# Patient Record
Sex: Female | Born: 1950 | Race: Asian | Hispanic: No | Marital: Single | Smoking: Never smoker
Health system: Southern US, Community
[De-identification: ages and names within clinical notes are randomized; demographics above are authoritative.]

## PROBLEM LIST (undated history)

## (undated) DIAGNOSIS — H409 Unspecified glaucoma: Secondary | ICD-10-CM

## (undated) DIAGNOSIS — I10 Essential (primary) hypertension: Secondary | ICD-10-CM

## (undated) DIAGNOSIS — E785 Hyperlipidemia, unspecified: Secondary | ICD-10-CM

## (undated) DIAGNOSIS — C801 Malignant (primary) neoplasm, unspecified: Secondary | ICD-10-CM

## (undated) HISTORY — DX: Hyperlipidemia, unspecified: E78.5

## (undated) HISTORY — DX: Unspecified glaucoma: H40.9

## (undated) HISTORY — PX: BREAST SURGERY: SHX581

---

## 2016-09-10 ENCOUNTER — Emergency Department (HOSPITAL_COMMUNITY): Payer: Self-pay

## 2016-09-10 ENCOUNTER — Inpatient Hospital Stay (HOSPITAL_COMMUNITY)
Admission: EM | Admit: 2016-09-10 | Discharge: 2016-09-12 | DRG: 287 | Disposition: A | Discharge status: Home / Self Care | Payer: Self-pay | Attending: Internal Medicine | Admitting: Internal Medicine

## 2016-09-10 ENCOUNTER — Encounter (HOSPITAL_COMMUNITY): Payer: Self-pay | Admitting: Vascular Surgery

## 2016-09-10 DIAGNOSIS — E781 Pure hyperglyceridemia: Secondary | ICD-10-CM | POA: Diagnosis present

## 2016-09-10 DIAGNOSIS — I10 Essential (primary) hypertension: Secondary | ICD-10-CM | POA: Diagnosis present

## 2016-09-10 DIAGNOSIS — R001 Bradycardia, unspecified: Secondary | ICD-10-CM | POA: Diagnosis present

## 2016-09-10 DIAGNOSIS — R079 Chest pain, unspecified: Secondary | ICD-10-CM | POA: Diagnosis present

## 2016-09-10 DIAGNOSIS — R0609 Other forms of dyspnea: Secondary | ICD-10-CM | POA: Diagnosis present

## 2016-09-10 DIAGNOSIS — R072 Precordial pain: Principal | ICD-10-CM | POA: Diagnosis present

## 2016-09-10 HISTORY — DX: Essential (primary) hypertension: I10

## 2016-09-10 LAB — BASIC METABOLIC PANEL
ANION GAP: 8 (ref 5–15)
BUN: 9 mg/dL (ref 6–20)
CHLORIDE: 103 mmol/L (ref 101–111)
CO2: 26 mmol/L (ref 22–32)
Calcium: 8.9 mg/dL (ref 8.9–10.3)
Creatinine, Ser: 0.69 mg/dL (ref 0.44–1.00)
Glucose, Bld: 105 mg/dL — ABNORMAL HIGH (ref 65–99)
POTASSIUM: 3.8 mmol/L (ref 3.5–5.1)
SODIUM: 137 mmol/L (ref 135–145)

## 2016-09-10 LAB — CBC
HEMATOCRIT: 37.3 % (ref 36.0–46.0)
HEMOGLOBIN: 12.3 g/dL (ref 12.0–15.0)
MCH: 28.5 pg (ref 26.0–34.0)
MCHC: 33 g/dL (ref 30.0–36.0)
MCV: 86.5 fL (ref 78.0–100.0)
Platelets: 241 10*3/uL (ref 150–400)
RBC: 4.31 MIL/uL (ref 3.87–5.11)
RDW: 12.3 % (ref 11.5–15.5)
WBC: 5.5 10*3/uL (ref 4.0–10.5)

## 2016-09-10 LAB — I-STAT TROPONIN, ED: TROPONIN I, POC: 0 ng/mL (ref 0.00–0.08)

## 2016-09-10 MED ORDER — ASPIRIN 325 MG PO TABS
325.0000 mg | ORAL_TABLET | Freq: Once | ORAL | Status: AC
  Administered 2016-09-10: 325 mg via ORAL
  Filled 2016-09-10: qty 1

## 2016-09-10 NOTE — ED Provider Notes (Signed)
[facility]-EMERGENCY DEPT Provider Note   CSN: [id number] Arrival date & time: 09/10/16  2030  By signing my name below, I, Dana Mora, attest that this documentation has been prepared under the direction and in the presence of Dana Bilis, MD. Electronically Signed: Deland Mora, ED Scribe. 09/11/16. 12:23 AM.   History   Chief Complaint Chief Complaint  Patient presents with  . Chest Pain    The history is provided by the patient. No language interpreter was used.    HPI Comments: Dana Mora is a 66 y.o. female, with a PMHx of HTN per daughter, presents to the Emergency Department complaining of left-sided intermittent CP that began on 09/09/2016, and worsened PTA. The episodes of pain typically last a few hours. Her pain is non-radiating and she describes the pain as throbbing. She reports associated SOB with episodes of CP today. She also notes dizziness. Pain and SOB increases upon exertion (walking up the stairs), and resting relieves the pain. She also notes increased pain with deep breaths. She denies associated leg swelling. Pt also denies PMHx associated with her current symptoms including PE, MI, HLD, DM, and DVT. Pt does not take medication for HTN. She does not smoke. Symptoms are currently relieved.  History reviewed. No pertinent past medical history.  There are no active problems to display for this patient.   History reviewed. No pertinent surgical history.  OB History    No data available       Home Medications   none   Family History No family history on file.  Social History Social History  Substance Use Topics  . Smoking status: Never Smoker  . Smokeless tobacco: Never Used  . Alcohol use No     Allergies   Patient has no allergy information on record.   Review of Systems Review of Systems All systems reviewed and are negative for acute change except as noted in the HPI.    Physical Exam Updated Vital Signs BP (!) 154/95  (BP Location: Left Arm)   Pulse 64   Temp 97.8 F (36.6 C) (Oral)   Resp 16   SpO2 99%   Physical Exam  Constitutional: She is oriented to person, place, and time. She appears well-developed and well-nourished. No distress.  HENT:  Head: Normocephalic and atraumatic.  Eyes: EOM are normal.  Neck: Normal range of motion.  Cardiovascular: Normal rate, regular rhythm and normal heart sounds.   Pulmonary/Chest: Effort normal and breath sounds normal.  Abdominal: Soft. She exhibits no distension. There is no tenderness.  Musculoskeletal: Normal range of motion.  Neurological: She is alert and oriented to person, place, and time.  Skin: Skin is warm and dry.  Psychiatric: She has a normal mood and affect. Judgment normal.  Nursing note and vitals reviewed.    ED Treatments / Results  DIAGNOSTIC STUDIES: Oxygen Saturation is 99% on RA, normal by my interpretation.   COORDINATION OF CARE: 11:22 AM-Discussed next steps with pt and daughter. Pt verbalized understanding and is agreeable with the plan.    Labs (all labs ordered are listed, but only abnormal results are displayed) Labs Reviewed  BASIC METABOLIC PANEL - Abnormal; Notable for the following:       Result Value   Glucose, Bld 105 (*)    All other components within normal limits  CBC  I-STAT TROPOININ, ED    EKG  EKG Interpretation  Date/Time:  Wednesday September 10 2016 20:36:27 EDT Ventricular Rate:  59 PR Interval:  158 QRS  Duration: 80 QT Interval:  418 QTC Calculation: 413 R Axis:   1 Text Interpretation:  Sinus bradycardia Otherwise normal ECG Confirmed by KNOTT MD, DANIEL [phone]) on 09/10/2016 10:15:15 PM       Radiology Dg Chest 2 View  Result Date: 09/10/2016 CLINICAL DATA:  Left-sided chest pain or 2 days EXAM: CHEST  2 VIEW COMPARISON:  None. FINDINGS: Cardiac shadow is mildly enlarged. Aortic calcifications are again seen. The lungs are well aerated bilaterally without focal infiltrate or sizable  effusion. No acute bony abnormality is noted. IMPRESSION: No active cardiopulmonary disease. Electronically Signed   By: Alcide Clever M.D.   On: 09/10/2016 21:45    Procedures Procedures (including critical care time)  Medications Ordered in ED Medications - No data to display   Initial Impression / Assessment and Plan / ED Course  I have reviewed the triage vital signs and the nursing notes.  Pertinent labs & imaging results that were available during my care of the patient were reviewed by me and considered in my medical decision making (see chart for details).    Pt will be admitted for cardiac rule out. D dimer negative. Concerning story and no follow up  Final Clinical Impressions(s) / ED Diagnoses   Final diagnoses:  Precordial pain    New Prescriptions New Prescriptions   No medications on file  I personally performed the services described in this documentation, which was scribed in my presence. The recorded information has been reviewed and is accurate.       Dana Bilis, MD 09/13/16 [phone]

## 2016-09-10 NOTE — ED Notes (Signed)
Pt states she started having chest pain yesterday but had an episode of throbbing left sided chest pain that was worse and came on earlier this afternoon. She states that she is currently not having any chest pain but does feel tired.

## 2016-09-10 NOTE — ED Triage Notes (Signed)
Pt reports to the ED for eval of left sided CP that started yesterday. She describes the pain as a throbbing. She also reports some associated SOB. She denies any personal cardiac hx or any hx of chronic medical issues. Walking up the stairs caused increased pain and SOB. Resting makes the pain better. She has not had any medication for this pain. Denies any recent cough or colds

## 2016-09-11 ENCOUNTER — Encounter (HOSPITAL_COMMUNITY)
Admission: EM | Disposition: A | Discharge status: Home / Self Care | Payer: Self-pay | Source: Home / Self Care | Attending: Internal Medicine

## 2016-09-11 ENCOUNTER — Encounter (HOSPITAL_COMMUNITY): Payer: Self-pay | Admitting: Internal Medicine

## 2016-09-11 ENCOUNTER — Observation Stay (HOSPITAL_BASED_OUTPATIENT_CLINIC_OR_DEPARTMENT_OTHER): Payer: Self-pay

## 2016-09-11 DIAGNOSIS — R0609 Other forms of dyspnea: Secondary | ICD-10-CM | POA: Diagnosis present

## 2016-09-11 DIAGNOSIS — R072 Precordial pain: Principal | ICD-10-CM

## 2016-09-11 DIAGNOSIS — R001 Bradycardia, unspecified: Secondary | ICD-10-CM | POA: Diagnosis present

## 2016-09-11 DIAGNOSIS — R079 Chest pain, unspecified: Secondary | ICD-10-CM

## 2016-09-11 DIAGNOSIS — I10 Essential (primary) hypertension: Secondary | ICD-10-CM | POA: Diagnosis present

## 2016-09-11 HISTORY — PX: LEFT HEART CATH AND CORONARY ANGIOGRAPHY: CATH118249

## 2016-09-11 LAB — BASIC METABOLIC PANEL
Anion gap: 6 (ref 5–15)
BUN: 8 mg/dL (ref 6–20)
CALCIUM: 8.8 mg/dL — AB (ref 8.9–10.3)
CO2: 27 mmol/L (ref 22–32)
CREATININE: 0.7 mg/dL (ref 0.44–1.00)
Chloride: 106 mmol/L (ref 101–111)
GFR calc Af Amer: >60 mL/min (ref >60)
GFR calc non Af Amer: >60 mL/min (ref >60)
GLUCOSE: 109 mg/dL — AB (ref 65–99)
Potassium: 4.1 mmol/L (ref 3.5–5.1)
Sodium: 139 mmol/L (ref 135–145)

## 2016-09-11 LAB — CBC WITH DIFFERENTIAL/PLATELET
BASOS PCT: 0 %
Basophils Absolute: 0 10*3/uL (ref 0.0–0.1)
Eosinophils Absolute: 0.1 10*3/uL (ref 0.0–0.7)
Eosinophils Relative: 3 %
HEMATOCRIT: 37 % (ref 36.0–46.0)
Hemoglobin: 12.3 g/dL (ref 12.0–15.0)
LYMPHS PCT: 53 %
Lymphs Abs: 2.5 10*3/uL (ref 0.7–4.0)
MCH: 28.7 pg (ref 26.0–34.0)
MCHC: 33.2 g/dL (ref 30.0–36.0)
MCV: 86.4 fL (ref 78.0–100.0)
MONO ABS: 0.4 10*3/uL (ref 0.1–1.0)
MONOS PCT: 8 %
NEUTROS ABS: 1.6 10*3/uL — AB (ref 1.7–7.7)
Neutrophils Relative %: 36 %
Platelets: 209 10*3/uL (ref 150–400)
RBC: 4.28 MIL/uL (ref 3.87–5.11)
RDW: 12.6 % (ref 11.5–15.5)
WBC: 4.6 10*3/uL (ref 4.0–10.5)

## 2016-09-11 LAB — LIPID PANEL
Cholesterol: 175 mg/dL (ref 0–200)
HDL: 39 mg/dL — ABNORMAL LOW (ref >40)
LDL Cholesterol: 74 mg/dL (ref 0–99)
Total CHOL/HDL Ratio: 4.5 RATIO
Triglycerides: 310 mg/dL — ABNORMAL HIGH (ref <150)
VLDL: 62 mg/dL — ABNORMAL HIGH (ref 0–40)

## 2016-09-11 LAB — ECHOCARDIOGRAM COMPLETE
Height: 64 in
Weight: 2401.6 oz

## 2016-09-11 LAB — TSH: TSH: 2.975 u[IU]/mL (ref 0.350–4.500)

## 2016-09-11 LAB — I-STAT TROPONIN, ED: Troponin i, poc: 0 ng/mL (ref 0.00–0.08)

## 2016-09-11 LAB — TROPONIN I
Troponin I: <0.03 ng/mL (ref <0.03)
Troponin I: <0.03 ng/mL (ref <0.03)
Troponin I: <0.03 ng/mL (ref <0.03)

## 2016-09-11 LAB — PROTIME-INR
INR: 0.99
PROTHROMBIN TIME: 13.1 s (ref 11.4–15.2)

## 2016-09-11 LAB — HIV ANTIBODY (ROUTINE TESTING W REFLEX): HIV Screen 4th Generation wRfx: NONREACTIVE

## 2016-09-11 LAB — D-DIMER, QUANTITATIVE: D-Dimer, Quant: <0.27 ug/mL-FEU (ref 0.00–0.50)

## 2016-09-11 SURGERY — LEFT HEART CATH AND CORONARY ANGIOGRAPHY
Anesthesia: LOCAL

## 2016-09-11 MED ORDER — HEPARIN (PORCINE) IN NACL 2-0.9 UNIT/ML-% IJ SOLN
INTRAMUSCULAR | Status: DC | PRN
  Administered 2016-09-11: 17:00:00 via INTRA_ARTERIAL

## 2016-09-11 MED ORDER — ATORVASTATIN CALCIUM 80 MG PO TABS: 80.0000 mg | ORAL_TABLET | ORAL | Status: DC

## 2016-09-11 MED ORDER — GI COCKTAIL ~~LOC~~
30.0000 mL | Freq: Four times a day (QID) | ORAL | Status: DC | PRN
  Administered 2016-09-11: 30 mL via ORAL
  Filled 2016-09-11: qty 30

## 2016-09-11 MED ORDER — MIDAZOLAM HCL 2 MG/2ML IJ SOLN
INTRAMUSCULAR | Status: AC
  Filled 2016-09-11: qty 2

## 2016-09-11 MED ORDER — MORPHINE SULFATE (PF) 4 MG/ML IV SOLN
2.0000 mg | INTRAVENOUS | Status: DC | PRN
  Administered 2016-09-11: 2 mg via INTRAVENOUS
  Filled 2016-09-11: qty 1

## 2016-09-11 MED ORDER — ONDANSETRON HCL 4 MG/2ML IJ SOLN: 4.0000 mg | Freq: Four times a day (QID) | INTRAMUSCULAR | Status: DC | PRN

## 2016-09-11 MED ORDER — HEPARIN (PORCINE) IN NACL 2-0.9 UNIT/ML-% IJ SOLN
INTRAMUSCULAR | Status: AC
  Filled 2016-09-11: qty 1000

## 2016-09-11 MED ORDER — ASPIRIN 81 MG PO CHEW
81.0000 mg | CHEWABLE_TABLET | Freq: Every day | ORAL | Status: DC
  Administered 2016-09-11 – 2016-09-12 (×2): 81 mg via ORAL
  Filled 2016-09-11 (×2): qty 1

## 2016-09-11 MED ORDER — VERAPAMIL HCL 2.5 MG/ML IV SOLN
INTRAVENOUS | Status: AC
  Filled 2016-09-11: qty 2

## 2016-09-11 MED ORDER — HEPARIN SODIUM (PORCINE) 1000 UNIT/ML IJ SOLN
INTRAMUSCULAR | Status: AC
  Filled 2016-09-11: qty 1

## 2016-09-11 MED ORDER — LIDOCAINE HCL (PF) 1 % IJ SOLN
INTRAMUSCULAR | Status: AC
  Filled 2016-09-11: qty 30

## 2016-09-11 MED ORDER — IOPAMIDOL (ISOVUE-370) INJECTION 76%
INTRAVENOUS | Status: AC
  Filled 2016-09-11: qty 100

## 2016-09-11 MED ORDER — NITROGLYCERIN 0.4 MG SL SUBL
0.4000 mg | SUBLINGUAL_TABLET | SUBLINGUAL | Status: DC | PRN
  Administered 2016-09-11: 0.4 mg via SUBLINGUAL
  Filled 2016-09-11: qty 1

## 2016-09-11 MED ORDER — ENOXAPARIN SODIUM 40 MG/0.4ML ~~LOC~~ SOLN
40.0000 mg | SUBCUTANEOUS | Status: DC
  Administered 2016-09-11 – 2016-09-12 (×2): 40 mg via SUBCUTANEOUS
  Filled 2016-09-11 (×2): qty 0.4

## 2016-09-11 MED ORDER — SODIUM CHLORIDE 0.9 % IV SOLN: 250.0000 mL | INTRAVENOUS | Status: DC | PRN

## 2016-09-11 MED ORDER — HYDRALAZINE HCL 20 MG/ML IJ SOLN: 10.0000 mg | INTRAMUSCULAR | Status: DC | PRN

## 2016-09-11 MED ORDER — ONDANSETRON HCL 4 MG/2ML IJ SOLN
4.0000 mg | Freq: Four times a day (QID) | INTRAMUSCULAR | Status: DC | PRN
  Administered 2016-09-11: 4 mg via INTRAVENOUS
  Filled 2016-09-11: qty 2

## 2016-09-11 MED ORDER — SODIUM CHLORIDE 0.9% FLUSH
3.0000 mL | Freq: Two times a day (BID) | INTRAVENOUS | Status: DC
  Administered 2016-09-11: 3 mL via INTRAVENOUS

## 2016-09-11 MED ORDER — SODIUM CHLORIDE 0.9% FLUSH: 3.0000 mL | INTRAVENOUS | Status: DC | PRN

## 2016-09-11 MED ORDER — HEPARIN SODIUM (PORCINE) 1000 UNIT/ML IJ SOLN
INTRAMUSCULAR | Status: DC | PRN
  Administered 2016-09-11: 3500 [IU] via INTRAVENOUS

## 2016-09-11 MED ORDER — FENTANYL CITRATE (PF) 100 MCG/2ML IJ SOLN
INTRAMUSCULAR | Status: AC
  Filled 2016-09-11: qty 2

## 2016-09-11 MED ORDER — SODIUM CHLORIDE 0.9 % IV SOLN: INTRAVENOUS | Status: AC

## 2016-09-11 MED ORDER — SODIUM CHLORIDE 0.9 % WEIGHT BASED INFUSION: 1.0000 mL/kg/h | INTRAVENOUS | Status: DC

## 2016-09-11 MED ORDER — ATORVASTATIN CALCIUM 80 MG PO TABS
80.0000 mg | ORAL_TABLET | Freq: Every day | ORAL | Status: DC
Start: 2016-09-12 — End: 2016-09-11

## 2016-09-11 MED ORDER — NITROGLYCERIN 0.4 MG SL SUBL
SUBLINGUAL_TABLET | SUBLINGUAL | Status: AC
  Administered 2016-09-11: 0.4 mg
  Filled 2016-09-11: qty 1

## 2016-09-11 MED ORDER — IOPAMIDOL (ISOVUE-370) INJECTION 76%
INTRAVENOUS | Status: DC | PRN
  Administered 2016-09-11: 55 mL via INTRA_ARTERIAL

## 2016-09-11 MED ORDER — ASPIRIN 81 MG PO CHEW: 81.0000 mg | CHEWABLE_TABLET | ORAL | Status: AC

## 2016-09-11 MED ORDER — ACETAMINOPHEN 325 MG PO TABS: 650.0000 mg | ORAL_TABLET | ORAL | Status: DC | PRN

## 2016-09-11 MED ORDER — ATORVASTATIN CALCIUM 20 MG PO TABS: 20.0000 mg | ORAL_TABLET | ORAL | Status: DC

## 2016-09-11 MED ORDER — ATORVASTATIN CALCIUM 20 MG PO TABS: 20.0000 mg | ORAL_TABLET | Freq: Every day | ORAL | Status: DC

## 2016-09-11 MED ORDER — HEPARIN (PORCINE) IN NACL 2-0.9 UNIT/ML-% IJ SOLN
INTRAMUSCULAR | Status: DC | PRN
  Administered 2016-09-11: 1000 mL

## 2016-09-11 MED ORDER — MIDAZOLAM HCL 2 MG/2ML IJ SOLN
INTRAMUSCULAR | Status: DC | PRN
  Administered 2016-09-11: 2 mg via INTRAVENOUS

## 2016-09-11 MED ORDER — SODIUM CHLORIDE 0.9 % WEIGHT BASED INFUSION
3.0000 mL/kg/h | INTRAVENOUS | Status: DC
  Administered 2016-09-11: 3 mL/kg/h via INTRAVENOUS

## 2016-09-11 MED ORDER — FENTANYL CITRATE (PF) 100 MCG/2ML IJ SOLN
INTRAMUSCULAR | Status: DC | PRN
  Administered 2016-09-11: 25 ug via INTRAVENOUS

## 2016-09-11 MED ORDER — ATORVASTATIN CALCIUM 20 MG PO TABS
20.0000 mg | ORAL_TABLET | ORAL | Status: DC
  Administered 2016-09-11: 20 mg via ORAL
  Filled 2016-09-11: qty 1

## 2016-09-11 MED ORDER — LIDOCAINE HCL (PF) 1 % IJ SOLN
INTRAMUSCULAR | Status: DC | PRN
  Administered 2016-09-11: 2 mL

## 2016-09-11 SURGICAL SUPPLY — 11 items
CATH 5FR JL3.5 JR4 ANG PIG MP (CATHETERS) ×2 IMPLANT
DEVICE RAD COMP TR BAND LRG (VASCULAR PRODUCTS) ×2 IMPLANT
GLIDESHEATH SLEND SS 6F .021 (SHEATH) ×2 IMPLANT
GUIDEWIRE INQWIRE 1.5J.035X260 (WIRE) ×1 IMPLANT
INQWIRE 1.5J .035X260CM (WIRE) ×2
KIT HEART LEFT (KITS) ×2 IMPLANT
PACK CARDIAC CATHETERIZATION (CUSTOM PROCEDURE TRAY) ×2 IMPLANT
SYR MEDRAD MARK V 150ML (SYRINGE) ×2 IMPLANT
TRANSDUCER W/STOPCOCK (MISCELLANEOUS) ×2 IMPLANT
TUBING CIL FLEX 10 FLL-RA (TUBING) ×2 IMPLANT
WIRE HI TORQ VERSACORE-J 145CM (WIRE) ×2 IMPLANT

## 2016-09-11 NOTE — Consult Note (Signed)
Cardiology Consult    Patient ID: Dana Mora MRN: [id number], DOB/AGE: 09-26-1950   Admit date: 09/10/2016 Date of Consult: 09/11/2016  Primary Physician: No PCP Per Patient Primary Cardiologist: new - Dr. Antoine Poche Requesting Provider: Dr. Susie Cassette  Reason for Consult: chest pain  Patient Profile    Dana Mora is a 66 yo female with a PMH significant for HTN who presented to [facility] with 2-day history of left-sided chest pain. She is visiting from [place], records unavailable.   Dana Mora is a 66 y.o. female who is being seen today for the evaluation of chest pain at the request of Dr. Susie Cassette.   Past Medical History   Past Medical History:  Diagnosis Date  . HTN (hypertension)     History reviewed. No pertinent surgical history.   Allergies  No Known Allergies  History of Present Illness    Dana Mora is visiting from [place] and presented to [facility] with 2-day history of left-sided chest pain without radiation. Her daughter is present at bedside and helps translate. History collected from daughter and patient.   Pt states 2 days ago, she was climbing a flight of stairs when she had sudden onset of substernal-left-sided chest pain. The pain did not radiate anywhere, was rated as a 6/10, and was relieved with rest. She felt very weak after. She states she had shortness of breath and pre-syncope with the chest pain. She also reports feelings of palpitations. She did not tell her family at the time of this first episode of chest pain. Yesterday, she again climbed stairs and had a recurrence of the substernal chest pain, shortness of breath, and pre-syncope. She did not have a LOC. She called her daughter who brought her to [facility]. The second episode of chest pain lasted approximately 2 hrs. She is unsure what relieved the pain. The pain was worse with inspiration, but was not positional. The pain is worse with exertion. She denies diaphoresis, vision changes,  N/V, and lower extremity swelling. The family states she does not have significant health problems, other than hypertension, and never complains of pain.   She states her only medical problem is HTN, but she does not take medication at home other than a multi-vitamin. She has never seen a cardiologist.  Shortly after leaving patient room, the patient c/o of a third episode of chest pain. EKG stable.     Inpatient Medications    . aspirin  81 mg Oral Daily  . atorvastatin  20 mg Oral q1800  . enoxaparin (LOVENOX) injection  40 mg Subcutaneous Q24H     Outpatient Medications    Prior to Admission medications   Medication Sig Start Date End Date Taking? Authorizing Provider  Multiple Vitamin (MULTIVITAMIN WITH MINERALS) TABS tablet Take 1 tablet by mouth daily.   Yes Historical Provider, MD     Family History     Family History  Problem Relation Age of Onset  . Hypertension Father     Social History    Social History   Social History  . Marital status: Widowed    Spouse name: N/A  . Number of children: N/A  . Years of education: N/A   Occupational History  . Not on file.   Social History Main Topics  . Smoking status: Never Smoker  . Smokeless tobacco: Never Used  . Alcohol use No  . Drug use: No  . Sexual activity: Not Currently   Other Topics Concern  . Not on file   Social  History Narrative   Visiting from [place], living with daughter at the moment.     Review of Systems    General:  No chills, fever, night sweats or weight changes.  Cardiovascular:  + chest pain, No dyspnea on exertion, edema, orthopnea, + palpitations, no paroxysmal nocturnal dyspnea. Dermatological: No rash, lesions/masses Respiratory: No cough, + dyspnea Urologic: No hematuria, dysuria Abdominal:   No nausea, vomiting, diarrhea, bright red blood per rectum, melena, or hematemesis Neurologic:  No visual changes, changes in mental status.  All other systems reviewed and are  otherwise negative except as noted above.  Physical Exam    Blood pressure 134/73, pulse (!) 57, temperature 97.6 F (36.4 C), temperature source Oral, resp. rate 18, height  (1.626 m), weight 150 lb 1.6 oz (68.1 kg), SpO2 100 %.  General: Pleasant, NAD Psych: Normal affect. Neuro: Alert and oriented X 3. Moves all extremities spontaneously. HEENT: Normal  Neck: Supple without bruits or JVD. Lungs:  Resp regular and unlabored, CTA. Heart: RRR no s3, s4, or murmurs. Abdomen: Soft, non-tender, non-distended, BS + x 4.  Extremities: No clubbing, cyanosis or edema. DP/PT/Radials 2+ and equal bilaterally.  Labs    Troponin [facility] Test)  Recent Labs  09/10/16 2353  TROPIPOC 0.00    Recent Labs  09/11/16 0243 09/11/16 0433 09/11/16 0740  TROPONINI <0.03 <0.03 <0.03   Lab Results  Component Value Date   WBC 4.6 09/11/2016   HGB 12.3 09/11/2016   HCT 37.0 09/11/2016   MCV 86.4 09/11/2016   PLT 209 09/11/2016     Recent Labs Lab 09/11/16 0433  NA 139  K 4.1  CL 106  CO2 27  BUN 8  CREATININE 0.70  CALCIUM 8.8*  GLUCOSE 109*   Lab Results  Component Value Date   CHOL 175 09/11/2016   HDL 39 (L) 09/11/2016   LDLCALC 74 09/11/2016   TRIG 310 (H) 09/11/2016   Lab Results  Component Value Date   DDIMER <0.27 09/10/2016     Radiology Studies    Dg Chest 2 View  Result Date: 09/10/2016 CLINICAL DATA:  Left-sided chest pain or 2 days EXAM: CHEST  2 VIEW COMPARISON:  None. FINDINGS: Cardiac shadow is mildly enlarged. Aortic calcifications are again seen. The lungs are well aerated bilaterally without focal infiltrate or sizable effusion. No acute bony abnormality is noted. IMPRESSION: No active cardiopulmonary disease. Electronically Signed   By: Alcide Clever M.D.   On: 09/10/2016 21:45    ECG & Cardiac Imaging    EKG 09/10/16: NSR   Echocardiogram 09/11/16: pending   Assessment & Plan    1. Chest pain - troponin x 3 negative - EKG NSR,  without signs of ischemia - D dimer was negative (< 0.27) - CXR without acute findings The pt describes chest pain with both typical and atypical features. Her troponins have been negative and her EKG is without ischemic changes. Echocardiogram is pending. Labs are WNL with the exception of elevated triglycerides. HTN and obesity are her only risk factors. She does not have a family history of heart disease. Will discuss with attending further ischemic workup, cardiac CT vs heart catheterization.    2. HTN - pt states she does not take medications at home for blood pressure - sBP is running in the 140-160s - will suggest an antihypertensive after echo read    3. High triglycerides - triglycerides 310    Signed, Marcelino Duster, [place] 09/11/2016, 12:36 PM [phone]  History and all data above reviewed.  Patient examined.  I agree with the findings as above.  The patient presents for evaluation of chest pain.  She has no past cardiac history.  She developed chest pain after walking up stairs yesterday.  This was a left upper severe throbbing.  She took Aleve and had no significant improvement.  She came to the ED and her pain had improved.  She has had no acute EKG changes and no enzyme elevation. However, she had recurrent pain that went away with NTG.  She is currently pain free.  She has typically been active without complaints.  She does not see an MD routinely and does not think that she has DM, hyperlipidemia or HTN but this is not clear.  She has been visiting her family in the [place] for 4 months but lives in [place].   The patient exam reveals COR:RRR  ,  Lungs: Clear  ,  Abd: Positive bowel sounds, no rebound no guarding, Ext No edema  .  All available labs, radiology testing, previous records reviewed. Agree with documented assessment and plan. Chest pain.  Consistent with resting unstable angina.  Cardiac cath is indicated.  The patient understands that risks included but are not  limited to stroke (1 in 1000), death (1 in 1000), kidney failure [usually temporary] (1 in 500), bleeding (1 in 200), allergic reaction [possibly serious] (1 in 200).  The patient understands and agrees to proceed.     Fayrene Fearing Kalven Ganim  2:42 PM  09/11/2016

## 2016-09-11 NOTE — Interval H&P Note (Signed)
Cath Lab Visit (complete for each Cath Lab visit)  Clinical Evaluation Leading to the Procedure:   ACS: Yes.    Non-ACS:    Anginal Classification: CCS IV  Anti-ischemic medical therapy: Minimal Therapy (1 class of medications)  Non-Invasive Test Results: No non-invasive testing performed  Prior CABG: No previous CABG  New onset chest pain    History and Physical Interval Note:  09/11/2016 4:27 PM  Dana Mora  has presented today for surgery, with the diagnosis of cp  The various methods of treatment have been discussed with the patient and family. After consideration of risks, benefits and other options for treatment, the patient has consented to  Procedure(s): Left Heart Cath and Coronary Angiography (N/A) as a surgical intervention .  The patient's history has been reviewed, patient examined, no change in status, stable for surgery.  I have reviewed the patient's chart and labs.  Questions were answered to the patient's satisfaction.     Lance Muss

## 2016-09-11 NOTE — H&P (View-Only) (Signed)
 Cardiology Consult    Patient ID: Dana Mora MRN: [id number], DOB/AGE: 64/20/1952   Admit date: 09/10/2016 Date of Consult: 09/11/2016  Primary Physician: No PCP Per Patient Primary Cardiologist: new - Dr. Zakariye Nee Requesting Provider: Dr. Abrol  Reason for Consult: chest pain  Patient Profile    Dana Mora is a 66 yo female with a PMH significant for HTN who presented to [facility] with 2-day history of left-sided chest pain. She is visiting from [place], records unavailable.   Dana Mora is a 66 y.o. female who is being seen today for the evaluation of chest pain at the request of Dr. Abrol.   Past Medical History   Past Medical History:  Diagnosis Date  . HTN (hypertension)     History reviewed. No pertinent surgical history.   Allergies  No Known Allergies  History of Present Illness    Dana Mora is visiting from [place] and presented to [facility] with 2-day history of left-sided chest pain without radiation. Her daughter is present at bedside and helps translate. History collected from daughter and patient.   Pt states 2 days ago, she was climbing a flight of stairs when she had sudden onset of substernal-left-sided chest pain. The pain did not radiate anywhere, was rated as a 6/10, and was relieved with rest. She felt very weak after. She states she had shortness of breath and pre-syncope with the chest pain. She also reports feelings of palpitations. She did not tell her family at the time of this first episode of chest pain. Yesterday, she again climbed stairs and had a recurrence of the substernal chest pain, shortness of breath, and pre-syncope. She did not have a LOC. She called her daughter who brought her to [facility]. The second episode of chest pain lasted approximately 2 hrs. She is unsure what relieved the pain. The pain was worse with inspiration, but was not positional. The pain is worse with exertion. She denies diaphoresis, vision changes,  N/V, and lower extremity swelling. The family states she does not have significant health problems, other than hypertension, and never complains of pain.   She states her only medical problem is HTN, but she does not take medication at home other than a multi-vitamin. She has never seen a cardiologist.  Shortly after leaving patient room, the patient c/o of a third episode of chest pain. EKG stable.     Inpatient Medications    . aspirin  81 mg Oral Daily  . atorvastatin  20 mg Oral q1800  . enoxaparin (LOVENOX) injection  40 mg Subcutaneous Q24H     Outpatient Medications    Prior to Admission medications   Medication Sig Start Date End Date Taking? Authorizing Provider  Multiple Vitamin (MULTIVITAMIN WITH MINERALS) TABS tablet Take 1 tablet by mouth daily.   Yes Historical Provider, MD     Family History     Family History  Problem Relation Age of Onset  . Hypertension Father     Social History    Social History   Social History  . Marital status: Widowed    Spouse name: N/A  . Number of children: N/A  . Years of education: N/A   Occupational History  . Not on file.   Social History Main Topics  . Smoking status: Never Smoker  . Smokeless tobacco: Never Used  . Alcohol use No  . Drug use: No  . Sexual activity: Not Currently   Other Topics Concern  . Not on file   Social   History Narrative   Visiting from [place], living with daughter at the moment.     Review of Systems    General:  No chills, fever, night sweats or weight changes.  Cardiovascular:  + chest pain, No dyspnea on exertion, edema, orthopnea, + palpitations, no paroxysmal nocturnal dyspnea. Dermatological: No rash, lesions/masses Respiratory: No cough, + dyspnea Urologic: No hematuria, dysuria Abdominal:   No nausea, vomiting, diarrhea, bright red blood per rectum, melena, or hematemesis Neurologic:  No visual changes, changes in mental status.  All other systems reviewed and are  otherwise negative except as noted above.  Physical Exam    Blood pressure 134/73, pulse (!) 57, temperature 97.6 F (36.4 C), temperature source Oral, resp. rate 18, height 5' 4" (1.626 m), weight 150 lb 1.6 oz (68.1 kg), SpO2 100 %.  General: Pleasant, NAD Psych: Normal affect. Neuro: Alert and oriented X 3. Moves all extremities spontaneously. HEENT: Normal  Neck: Supple without bruits or JVD. Lungs:  Resp regular and unlabored, CTA. Heart: RRR no s3, s4, or murmurs. Abdomen: Soft, non-tender, non-distended, BS + x 4.  Extremities: No clubbing, cyanosis or edema. DP/PT/Radials 2+ and equal bilaterally.  Labs    Troponin (Point of Care Test)  Recent Labs  09/10/16 2353  TROPIPOC 0.00    Recent Labs  09/11/16 0243 09/11/16 0433 09/11/16 0740  TROPONINI <0.03 <0.03 <0.03   Lab Results  Component Value Date   WBC 4.6 09/11/2016   HGB 12.3 09/11/2016   HCT 37.0 09/11/2016   MCV 86.4 09/11/2016   PLT 209 09/11/2016     Recent Labs Lab 09/11/16 0433  NA 139  K 4.1  CL 106  CO2 27  BUN 8  CREATININE 0.70  CALCIUM 8.8*  GLUCOSE 109*   Lab Results  Component Value Date   CHOL 175 09/11/2016   HDL 39 (L) 09/11/2016   LDLCALC 74 09/11/2016   TRIG 310 (H) 09/11/2016   Lab Results  Component Value Date   DDIMER <0.27 09/10/2016     Radiology Studies    Dg Chest 2 View  Result Date: 09/10/2016 CLINICAL DATA:  Left-sided chest pain or 2 days EXAM: CHEST  2 VIEW COMPARISON:  None. FINDINGS: Cardiac shadow is mildly enlarged. Aortic calcifications are again seen. The lungs are well aerated bilaterally without focal infiltrate or sizable effusion. No acute bony abnormality is noted. IMPRESSION: No active cardiopulmonary disease. Electronically Signed   By: Mark  Lukens M.D.   On: 09/10/2016 21:45    ECG & Cardiac Imaging    EKG 09/10/16: NSR   Echocardiogram 09/11/16: pending   Assessment & Plan    1. Chest pain - troponin x 3 negative - EKG NSR,  without signs of ischemia - D dimer was negative (< 0.27) - CXR without acute findings The pt describes chest pain with both typical and atypical features. Her troponins have been negative and her EKG is without ischemic changes. Echocardiogram is pending. Labs are WNL with the exception of elevated triglycerides. HTN and obesity are her only risk factors. She does not have a family history of heart disease. Will discuss with attending further ischemic workup, cardiac CT vs heart catheterization.    2. HTN - pt states she does not take medications at home for blood pressure - sBP is running in the 140-160s - will suggest an antihypertensive after echo read    3. High triglycerides - triglycerides 310    Signed, Angela Nicole Duke, PA-C 09/11/2016, 12:36 PM [phone]    History and all data above reviewed.  Patient examined.  I agree with the findings as above.  The patient presents for evaluation of chest pain.  She has no past cardiac history.  She developed chest pain after walking up stairs yesterday.  This was a left upper severe throbbing.  She took Aleve and had no significant improvement.  She came to the ED and her pain had improved.  She has had no acute EKG changes and no enzyme elevation. However, she had recurrent pain that went away with NTG.  She is currently pain free.  She has typically been active without complaints.  She does not see an MD routinely and does not think that she has DM, hyperlipidemia or HTN but this is not clear.  She has been visiting her family in the [place] for 4 months but lives in [place].   The patient exam reveals COR:RRR  ,  Lungs: Clear  ,  Abd: Positive bowel sounds, no rebound no guarding, Ext No edema  .  All available labs, radiology testing, previous records reviewed. Agree with documented assessment and plan. Chest pain.  Consistent with resting unstable angina.  Cardiac cath is indicated.  The patient understands that risks included but are not  limited to stroke (1 in 1000), death (1 in 1000), kidney failure [usually temporary] (1 in 500), bleeding (1 in 200), allergic reaction [possibly serious] (1 in 200).  The patient understands and agrees to proceed.     Riddhi Grether  2:42 PM  09/11/2016   

## 2016-09-11 NOTE — Progress Notes (Signed)
  Echocardiogram 2D Echocardiogram has been performed.  Raden Byington T Bianna Haran 09/11/2016, 12:28 PM

## 2016-09-11 NOTE — H&P (Addendum)
History and Physical    Dana Mora JYN:[id number] DOB: 1950-06-09 DOA: 09/10/2016  Referring MD/NP/PA: Dr. Patria Mane PCP: No primary care provider on file.  Patient coming from: Home  Chief Complaint: Chest pain  HPI: Dana Mora is a 66 y.o. female with medical history significant of HTN (not on any medications); who presents with complaints of 2 days of left-sided chest pain. Patient has been visiting for the last 4 months from [place]. Pain is described as constant, throbbing, and does not radiate anywhere. Patient notes that taking a deep inspiratory breath or any exertion such as walking up steps significantly worsens pain. Associated symptoms include shortness of breath, fatigue, and lightheadedness. Denies any palpitations, diaphoresis, cough, nausea, weight gain, leg swelling, focal weakness, headache, or changes in vision. Patient denies any known family history of any heart disease. Patient reports never having previous symptoms like this in the past.  ED Course: Upon admission into the emergency department patient was seen have blheart rates 47-65, blood pressure as high as 158/98, and all other vitals within normal limits. Labs were noted to be unremarkable including negative d-dimer and 2 sets of troponins. EKG showed sinus bradycardia. Chest x-ray showed no acute abnormalities. TRH called to admit for chest pain rule out. Pain symptoms resolved after being given 325 mg of aspirin.  Review of Systems: As per HPI otherwise 10 point review of systems negative.   Past Medical History:  Diagnosis Date  . HTN (hypertension)     History reviewed. No pertinent surgical history.   reports that she has never smoked. She has never used smokeless tobacco. She reports that she does not drink alcohol or use drugs.  No Known Allergies  History reviewed. No pertinent family history.  Prior to Admission medications   Medication Sig Start Date End Date Taking? Authorizing  Provider  Multiple Vitamin (MULTIVITAMIN WITH MINERALS) TABS tablet Take 1 tablet by mouth daily.   Yes Historical Provider, MD    Physical Exam: Constitutional: Older female in NAD, calm, comfortable Vitals:   09/10/16 2315 09/10/16 2330 09/11/16 0000 09/11/16 0015  BP: (!) 158/98 (!) 148/87 (!) 149/94 (!) 158/91  Pulse: 65 (!) 56 (!) 54 (!) 49  Resp: (!) Temp:      TempSrc:      SpO2: 100% 100% 99% 100%   Eyes: PERRL, lids and conjunctivae normal ENMT: Mucous membranes are moist. Posterior pharynx clear of any exudate or lesions.Normal dentition.  Neck: normal, supple, no masses, no thyromegaly Respiratory: clear to auscultation bilaterally, no wheezing, no crackles. Normal respiratory effort. No accessory muscle use.  Cardiovascular: Regular rate and rhythm, no murmurs / rubs / gallops. No extremity edema. 2+ pedal pulses. No carotid bruits. Pain not reproducible with palpation. Abdomen: no tenderness, no masses palpated. No hepatosplenomegaly. Bowel sounds positive.  Musculoskeletal: no clubbing / cyanosis. No joint deformity upper and lower extremities. Good ROM, no contractures. Normal muscle tone.  Skin: no rashes, lesions, ulcers. No induration Neurologic: CN 2-12 grossly intact. Sensation intact, DTR normal. Strength 5/5 in all 4.  Psychiatric: Normal judgment and insight. Alert and oriented x 3. Normal mood.     Labs on Admission: I have personally reviewed following labs and imaging studies  CBC:  Recent Labs Lab 09/10/16 2054  WBC 5.5  HGB 12.3  HCT 37.3  MCV 86.5  PLT 241   Basic Metabolic Panel:  Recent Labs Lab 09/10/16 2054  NA 137  K 3.8  CL 103  CO2  26  GLUCOSE 105*  BUN 9  CREATININE 0.69  CALCIUM 8.9   GFR: CrCl cannot be calculated (Unknown ideal weight.). Liver Function Tests: No results for input(s): AST, ALT, ALKPHOS, BILITOT, PROT, ALBUMIN in the last 168 hours. No results for input(s): LIPASE, AMYLASE in the last 168  hours. No results for input(s): AMMONIA in the last 168 hours. Coagulation Profile: No results for input(s): INR, PROTIME in the last 168 hours. Cardiac Enzymes: No results for input(s): CKTOTAL, CKMB, CKMBINDEX, TROPONINI in the last 168 hours. BNP (last 3 results) No results for input(s): PROBNP in the last 8760 hours. HbA1C: No results for input(s): HGBA1C in the last 72 hours. CBG: No results for input(s): GLUCAP in the last 168 hours. Lipid Profile: No results for input(s): CHOL, HDL, LDLCALC, TRIG, CHOLHDL, LDLDIRECT in the last 72 hours. Thyroid Function Tests: No results for input(s): TSH, T4TOTAL, FREET4, T3FREE, THYROIDAB in the last 72 hours. Anemia Panel: No results for input(s): VITAMINB12, FOLATE, FERRITIN, TIBC, IRON, RETICCTPCT in the last 72 hours. Urine analysis: No results found for: COLORURINE, APPEARANCEUR, LABSPEC, PHURINE, GLUCOSEU, HGBUR, BILIRUBINUR, KETONESUR, PROTEINUR, UROBILINOGEN, NITRITE, LEUKOCYTESUR Sepsis Labs: No results found for this or any previous visit (from the past 240 hour(s)).   Radiological Exams on Admission: Dg Chest 2 View  Result Date: 09/10/2016 CLINICAL DATA:  Left-sided chest pain or 2 days EXAM: CHEST  2 VIEW COMPARISON:  None. FINDINGS: Cardiac shadow is mildly enlarged. Aortic calcifications are again seen. The lungs are well aerated bilaterally without focal infiltrate or sizable effusion. No acute bony abnormality is noted. IMPRESSION: No active cardiopulmonary disease. Electronically Signed   By: Alcide Clever M.D.   On: 09/10/2016 21:45    EKG: Independently reviewed. Sinus Bradycardia  Assessment/Plan Chest pain: Acute. 66 year old female with reports of left-sided chest pain worsened with exertion. Initial workup shows troponins negative 2 and sinus bradycardia on EKG, but no significant ischemic changes. Heart score = 3. Low suspicion for PE as patient has had no recent travel, reports being active, and had negative  d-dimer. - Admit to telemetry bed - Chest pain order set initiated - Trend cardiac enzymes 3 - Check lipid panel in a.m.  - Continue aspirin - Check echocardiogram in a.m. - Consult cardiology in a.m. for further need of workup  Dyspnea on exertion: Chest x-ray was otherwise clear and patient maintaining O2 saturations on room air. Patient has no significant history of tobacco use. - May warrant 6 minute walk test - Check TSH  Bradycardia: Heart rates documented as low as 47 all in the emergency department. - Follow-up telemetry overnight  Essential hypertension: Patient reports previous history of hypertension but not currently taking any medications. Blood pressures noted to be elevated up to 158/98 on admission. - Hydralazine IV when necessary systolic blood pressures >11 - Continue to monitor  DVT prophylaxis: lovenox   Code Status: Full  Family Communication: Discussed plan of care with the patient family present at bedside Disposition Plan: Likely discharge home once medically cleared Consults called: none Admission status:Observation  Clydie Braun MD [facility] Pager [phone]  If 7PM-7AM, please contact night-coverage [email or web address] Password [facility]  09/11/2016, 12:46 AM

## 2016-09-11 NOTE — Progress Notes (Signed)
Patient seen and examined  66 y.o. female with medical history significant of HTN (not on any medications); who presents with complaints of 2 days of left-sided chest pain.Patient notes that taking a deep inspiratory breath or any exertion such as walking up steps significantly worsens pain. EKG showed sinus bradycardia. Chest x-ray showed no acute abnormalities. TRH called to admit for chest pain rule out. D-dimer, serial cardiac enzymes negative Triglycerides 310-initiated statin Cardiology consult for risk stratification,requested

## 2016-09-12 ENCOUNTER — Encounter (HOSPITAL_COMMUNITY): Payer: Self-pay | Admitting: Interventional Cardiology

## 2016-09-12 DIAGNOSIS — I10 Essential (primary) hypertension: Secondary | ICD-10-CM

## 2016-09-12 MED ORDER — ATORVASTATIN CALCIUM 20 MG PO TABS: 20.0000 mg | ORAL_TABLET | ORAL | 2 refills | Status: DC

## 2016-09-12 MED ORDER — ASPIRIN 81 MG PO CHEW: 81.0000 mg | CHEWABLE_TABLET | Freq: Every day | ORAL | 1 refills | Status: DC

## 2016-09-12 MED ORDER — NITROGLYCERIN 0.4 MG SL SUBL: 0.4000 mg | SUBLINGUAL_TABLET | SUBLINGUAL | 12 refills | Status: DC | PRN

## 2016-09-12 MED ORDER — OMEPRAZOLE 20 MG PO CPDR: 20.0000 mg | DELAYED_RELEASE_CAPSULE | Freq: Every day | ORAL | 1 refills | Status: DC

## 2016-09-12 MED ORDER — KETOROLAC TROMETHAMINE 60 MG/2ML IM SOLN: 60.0000 mg | Freq: Once | INTRAMUSCULAR | Status: DC

## 2016-09-12 NOTE — Progress Notes (Signed)
Progress Note  Patient Name: Dana Mora Date of Encounter: 09/12/2016  Primary Cardiologist: New (Hildagard Sobecki)  Subjective   No chest pain.  No SOB  Inpatient Medications    Scheduled Meds: . aspirin  81 mg Oral Daily  . atorvastatin  20 mg Oral Q M,W,F-2000  . enoxaparin (LOVENOX) injection  40 mg Subcutaneous Q24H  . sodium chloride flush  3 mL Intravenous Q12H   Continuous Infusions: . sodium chloride     PRN Meds: sodium chloride, acetaminophen, gi cocktail, hydrALAZINE, morphine injection, nitroGLYCERIN, ondansetron (ZOFRAN) IV, sodium chloride flush   Vital Signs    Vitals:   09/11/16 2100 09/11/16 2201 09/11/16 2300 09/12/16 0524  BP: 133/74 126/81 126/75 123/80  Pulse: 63  63 63  Resp:   18 18  Temp:   98.4 F (36.9 C) 98.1 F (36.7 C)  TempSrc:   Oral Oral  SpO2: 100% 100% 99% 100%  Weight:      Height:        Intake/Output Summary (Last 24 hours) at 09/12/16 0746 Last data filed at 09/11/16 2100  Gross per 24 hour  Intake              590 ml  Output             1500 ml  Net             -910 ml   Filed Weights   09/11/16 0237  Weight: 150 lb 1.6 oz (68.1 kg)    Telemetry    NSR - Personally Reviewed  ECG    NA - Personally Reviewed  Physical Exam   GEN: No acute distress.   Neck: No  JVD Cardiac: RR,  murmurs, rubs, or gallops.  Respiratory: Clear  to auscultation bilaterally. GI: Soft, nontender, non-distended  MS: No  edema; No deformity.  Right wrist without bleeding in bruising  Labs    Chemistry Recent Labs Lab 09/10/16 2054 09/11/16 0433  NA 137 139  K 3.8 4.1  CL 103 106  CO2 26 27  GLUCOSE 105* 109*  BUN 9 8  CREATININE 0.69 0.70  CALCIUM 8.9 8.8*  GFRNONAA >60 >60  GFRAA >60 >60  ANIONGAP 8 6     Hematology Recent Labs Lab 09/10/16 2054 09/11/16 0433  WBC 5.5 4.6  RBC 4.31 4.28  HGB 12.3 12.3  HCT 37.3 37.0  MCV 86.5 86.4  MCH 28.5 28.7  MCHC 33.0 33.2  RDW 12.3 12.6  PLT 241 209     Cardiac Enzymes Recent Labs Lab 09/11/16 0243 09/11/16 0433 09/11/16 0740  TROPONINI <0.03 <0.03 <0.03    Recent Labs Lab 09/10/16 2113 09/10/16 2353  TROPIPOC 0.00 0.00     BNPNo results for input(s): BNP, PROBNP in the last 168 hours.   DDimer  Recent Labs Lab 09/10/16 2054  DDIMER <0.27     Radiology    Dg Chest 2 View  Result Date: 09/10/2016 CLINICAL DATA:  Left-sided chest pain or 2 days EXAM: CHEST  2 VIEW COMPARISON:  None. FINDINGS: Cardiac shadow is mildly enlarged. Aortic calcifications are again seen. The lungs are well aerated bilaterally without focal infiltrate or sizable effusion. No acute bony abnormality is noted. IMPRESSION: No active cardiopulmonary disease. Electronically Signed   By: Alcide Clever M.D.   On: 09/10/2016 21:45    Cardiac Studies   ECHO  - Left ventricle: The cavity size was normal. Systolic function was   normal. The estimated ejection fraction was  in the range of 60%   to 65%. Wall motion was normal; there were no regional wall   motion abnormalities. Features are consistent with a pseudonormal   left ventricular filling pattern, with concomitant abnormal   relaxation and increased filling pressure (grade 2 diastolic   dysfunction). - Aortic valve: Moderately calcified annulus. Trileaflet; normal   thickness leaflets. - Mitral valve: Calcified annulus. - Atrial septum: There was increased thickness of the septum,   consistent with lipomatous hypertrophy. - Tricuspid valve: There was trivial regurgitation. - Pulmonary arteries: Systolic pressure could not be accurately   estimated.  CATH  The left ventricular systolic function is normal.  LV end diastolic pressure is normal.  The left ventricular ejection fraction is 55-65% by visual estimate.  There is no aortic valve stenosis.  No significant coronary artery disease.     Patient Profile     66 y.o. female with a PMH significant for HTN who presented to [facility]  with 2-day history of left-sided chest pain.  Assessment & Plan    CHEST PAIN:  No CAD.  No further cardiac work up.  Non anginal chest pain.    Signed, Rollene Rotunda, MD  09/12/2016, 7:46 AM

## 2016-09-12 NOTE — Progress Notes (Signed)
Pt stated she was having 5/10 chest pain.  EKG done, NP Craige Cotta notified.  Pt given nitro x 1 and is currently resting comfortably.  RN will continue to monitor.  Erenest Rasher, RN

## 2016-09-12 NOTE — Progress Notes (Signed)
Order received to discharge Pt.  Discharge education given to Pt with family at bedside.  All questions answered.  Pt stable, no c/o pain.  Pt stable to discharge.

## 2016-09-12 NOTE — Discharge Summary (Signed)
Physician Discharge Summary  Dana Mora MRN: [id number] DOB/AGE: 64-Nov-1952 64 y.o.  PCP: No PCP Per Patient   Admit date: 09/10/2016 Discharge date: 09/12/2016  Discharge Diagnoses:    Principal Problem:   Chest pain in adult Active Problems:   Dyspnea on exertion   Essential hypertension   Bradycardia    Follow-up recommendations Follow-up with PCP in 3-5 days , including all  additional recommended appointments as below Follow-up CBC, CMP in 3-5 days       Current Discharge Medication List    START taking these medications   Details  aspirin 81 MG chewable tablet Chew 1 tablet (81 mg total) by mouth daily. Qty: 30 tablet, Refills: 1    atorvastatin (LIPITOR) 20 MG tablet Take 1 tablet (20 mg total) by mouth every Monday, Wednesday, and Friday at 8 PM. Qty: 30 tablet, Refills: 2    nitroGLYCERIN (NITROSTAT) 0.4 MG SL tablet Place 1 tablet (0.4 mg total) under the tongue every 5 (five) minutes as needed for chest pain. Qty: 30 tablet, Refills: 12    omeprazole (PRILOSEC) 20 MG capsule Take 1 capsule (20 mg total) by mouth daily. Qty: 30 capsule, Refills: 1      CONTINUE these medications which have NOT CHANGED   Details  Multiple Vitamin (MULTIVITAMIN WITH MINERALS) TABS tablet Take 1 tablet by mouth daily.         Discharge Condition: Stable   Discharge Instructions Get Medicines reviewed and adjusted: Please take all your medications with you for your next visit with your Primary MD  Please request your Primary MD to go over all hospital tests and procedure/radiological results at the follow up, please ask your Primary MD to get all Hospital records sent to his/her office.  If you experience worsening of your admission symptoms, develop shortness of breath, life threatening emergency, suicidal or homicidal thoughts you must seek medical attention immediately by calling 911 or calling your MD immediately if symptoms less severe.  You must  read complete instructions/literature along with all the possible adverse reactions/side effects for all the Medicines you take and that have been prescribed to you. Take any new Medicines after you have completely understood and accpet all the possible adverse reactions/side effects.   Do not drive when taking Pain medications.   Do not take more than prescribed Pain, Sleep and Anxiety Medications  Special Instructions: If you have smoked or chewed Tobacco in the last 2 yrs please stop smoking, stop any regular Alcohol and or any Recreational drug use.  Wear Seat belts while driving.  Please note  You were cared for by a hospitalist during your hospital stay. Once you are discharged, your primary care physician will handle any further medical issues. Please note that NO REFILLS for any discharge medications will be authorized once you are discharged, as it is imperative that you return to your primary care physician (or establish a relationship with a primary care physician if you do not have one) for your aftercare needs so that they can reassess your need for medications and monitor your lab values.     No Known Allergies    Disposition: Home with family   Consults:  Cardiology    Significant Diagnostic Studies:  Dg Chest 2 View  Result Date: 09/10/2016 CLINICAL DATA:  Left-sided chest pain or 2 days EXAM: CHEST  2 VIEW COMPARISON:  None. FINDINGS: Cardiac shadow is mildly enlarged. Aortic calcifications are again seen. The lungs are well aerated bilaterally without focal infiltrate  or sizable effusion. No acute bony abnormality is noted. IMPRESSION: No active cardiopulmonary disease. Electronically Signed   By: Inez Catalina M.D.   On: 09/10/2016 21:45    echocardiogram  LV EF: 60% -   65%  ------------------------------------------------------------------- History:   PMH:  chest pain.  Dyspnea.  Risk  factors: Hypertension.  ------------------------------------------------------------------- Study Conclusions  - Left ventricle: The cavity size was normal. Systolic function was   normal. The estimated ejection fraction was in the range of 60%   to 65%. Wall motion was normal; there were no regional wall   motion abnormalities. Features are consistent with a pseudonormal   left ventricular filling pattern, with concomitant abnormal   relaxation and increased filling pressure (grade 2 diastolic   dysfunction). - Aortic valve: Moderately calcified annulus. Trileaflet; normal   thickness leaflets. - Mitral valve: Calcified annulus. - Atrial septum: There was increased thickness of the septum,   consistent with lipomatous hypertrophy. - Tricuspid valve: There was trivial regurgitation. - Pulmonary arteries: Systolic pressure could not be accurately   estimated.     Filed Weights   09/11/16 0237  Weight: 68.1 kg (150 lb 1.6 oz)     Microbiology: No results found for this or any previous visit (from the past 240 hour(s)).     Blood Culture No results found for: SDES, SPECREQUEST, CULT, REPTSTATUS    Labs: Results for orders placed or performed during the hospital encounter of 09/10/16 (from the past 48 hour(s))  Basic metabolic panel     Status: Abnormal   Collection Time: 09/10/16  8:54 PM  Result Value Ref Range   Sodium 137 135 - 145 mmol/L   Potassium 3.8 3.5 - 5.1 mmol/L   Chloride 103 101 - 111 mmol/L   CO2 26 22 - 32 mmol/L   Glucose, Bld 105 (H) 65 - 99 mg/dL   BUN 9 6 - 20 mg/dL   Creatinine, Ser 0.69 0.44 - 1.00 mg/dL   Calcium 8.9 8.9 - 10.3 mg/dL   GFR calc non Af Amer >60 >60 mL/min   GFR calc Af Amer >60 >60 mL/min    Comment: (NOTE) The eGFR has been calculated using the CKD EPI equation. This calculation has not been validated in all clinical situations. eGFR's persistently <60 mL/min signify possible Chronic Kidney Disease.    Anion gap 8 5 -  15  CBC     Status: None   Collection Time: 09/10/16  8:54 PM  Result Value Ref Range   WBC 5.5 4.0 - 10.5 K/uL   RBC 4.31 3.87 - 5.11 MIL/uL   Hemoglobin 12.3 12.0 - 15.0 g/dL   HCT 37.3 36.0 - 46.0 %   MCV 86.5 78.0 - 100.0 fL   MCH 28.5 26.0 - 34.0 pg   MCHC 33.0 30.0 - 36.0 g/dL   RDW 12.3 11.5 - 15.5 %   Platelets 241 150 - 400 K/uL  D-dimer, quantitative (not at [facility])     Status: None   Collection Time: 09/10/16  8:54 PM  Result Value Ref Range   D-Dimer, Quant <0.27 0.00 - 0.50 ug/mL-FEU    Comment: (NOTE) At the manufacturer cut-off of 0.50 ug/mL FEU, this assay has been documented to exclude PE with a sensitivity and negative predictive value of 97 to 99%.  At this time, this assay has not been approved by the FDA to exclude DVT/VTE. Results should be correlated with clinical presentation.   I-stat troponin, ED     Status: None  Collection Time: 09/10/16  9:13 PM  Result Value Ref Range   Troponin i, poc 0.00 0.00 - 0.08 ng/mL   Comment 3            Comment: Due to the release kinetics of cTnI, a negative result within the first hours of the onset of symptoms does not rule out myocardial infarction with certainty. If myocardial infarction is still suspected, repeat the test at appropriate intervals.   I-stat troponin, ED     Status: None   Collection Time: 09/10/16 11:53 PM  Result Value Ref Range   Troponin i, poc 0.00 0.00 - 0.08 ng/mL   Comment 3            Comment: Due to the release kinetics of cTnI, a negative result within the first hours of the onset of symptoms does not rule out myocardial infarction with certainty. If myocardial infarction is still suspected, repeat the test at appropriate intervals.   Lipid panel     Status: Abnormal   Collection Time: 09/11/16  2:31 AM  Result Value Ref Range   Cholesterol 175 0 - 200 mg/dL   Triglycerides 310 (H) <150 mg/dL   HDL 39 (L) >40 mg/dL   Total CHOL/HDL Ratio 4.5 RATIO   VLDL 62 (H) 0 - 40 mg/dL    LDL Cholesterol 74 0 - 99 mg/dL    Comment:        Total Cholesterol/HDL:CHD Risk Coronary Heart Disease Risk Table                     Men   Women  1/2 Average Risk   3.4   3.3  Average Risk       5.0   4.4  2 X Average Risk   9.6   7.1  3 X Average Risk  23.4   11.0        Use the calculated Patient Ratio above and the CHD Risk Table to determine the patient's CHD Risk.        ATP III CLASSIFICATION (LDL):  <100     mg/dL   Optimal  100-129  mg/dL   Near or Above                    Optimal  130-159  mg/dL   Borderline  160-189  mg/dL   High  >190     mg/dL   Very High   Troponin I-serum (0, 3, 6 hours)     Status: None   Collection Time: 09/11/16  2:43 AM  Result Value Ref Range   Troponin I <0.03 <0.03 ng/mL  Troponin I-serum (0, 3, 6 hours)     Status: None   Collection Time: 09/11/16  4:33 AM  Result Value Ref Range   Troponin I <0.03 <0.03 ng/mL  CBC with Differential/Platelet     Status: Abnormal   Collection Time: 09/11/16  4:33 AM  Result Value Ref Range   WBC 4.6 4.0 - 10.5 K/uL   RBC 4.28 3.87 - 5.11 MIL/uL   Hemoglobin 12.3 12.0 - 15.0 g/dL   HCT 37.0 36.0 - 46.0 %   MCV 86.4 78.0 - 100.0 fL   MCH 28.7 26.0 - 34.0 pg   MCHC 33.2 30.0 - 36.0 g/dL   RDW 12.6 11.5 - 15.5 %   Platelets 209 150 - 400 K/uL   Neutrophils Relative % 36 %   Neutro Abs 1.6 (L) 1.7 - 7.7 K/uL  Lymphocytes Relative 53 %   Lymphs Abs 2.5 0.7 - 4.0 K/uL   Monocytes Relative 8 %   Monocytes Absolute 0.4 0.1 - 1.0 K/uL   Eosinophils Relative 3 %   Eosinophils Absolute 0.1 0.0 - 0.7 K/uL   Basophils Relative 0 %   Basophils Absolute 0.0 0.0 - 0.1 K/uL  Basic metabolic panel     Status: Abnormal   Collection Time: 09/11/16  4:33 AM  Result Value Ref Range   Sodium 139 135 - 145 mmol/L   Potassium 4.1 3.5 - 5.1 mmol/L   Chloride 106 101 - 111 mmol/L   CO2 27 22 - 32 mmol/L   Glucose, Bld 109 (H) 65 - 99 mg/dL   BUN 8 6 - 20 mg/dL   Creatinine, Ser 0.70 0.44 - 1.00 mg/dL    Calcium 8.8 (L) 8.9 - 10.3 mg/dL   GFR calc non Af Amer >60 >60 mL/min   GFR calc Af Amer >60 >60 mL/min    Comment: (NOTE) The eGFR has been calculated using the CKD EPI equation. This calculation has not been validated in all clinical situations. eGFR's persistently <60 mL/min signify possible Chronic Kidney Disease.    Anion gap 6 5 - 15  Troponin I-serum (0, 3, 6 hours)     Status: None   Collection Time: 09/11/16  7:40 AM  Result Value Ref Range   Troponin I <0.03 <0.03 ng/mL  HIV antibody     Status: None   Collection Time: 09/11/16  7:40 AM  Result Value Ref Range   HIV Screen 4th Generation wRfx Non Reactive Non Reactive    Comment: (NOTE) Performed At: [facility] [place], [place] [id number] Lindon Romp MD OY:[id number]   TSH     Status: None   Collection Time: 09/11/16  7:40 AM  Result Value Ref Range   TSH 2.975 0.350 - 4.500 uIU/mL    Comment: Performed by a 3rd Generation assay with a functional sensitivity of <=0.01 uIU/mL.  Protime-INR     Status: None   Collection Time: 09/11/16  3:09 PM  Result Value Ref Range   Prothrombin Time 13.1 11.4 - 15.2 seconds   INR 0.99      Lipid Panel     Component Value Date/Time   CHOL 175 09/11/2016 0231   TRIG 310 (H) 09/11/2016 0231   HDL 39 (L) 09/11/2016 0231   CHOLHDL 4.5 09/11/2016 0231   VLDL 62 (H) 09/11/2016 0231   LDLCALC 74 09/11/2016 0231     No results found for: HGBA1C   Lab Results  Component Value Date   LDLCALC 74 09/11/2016   CREATININE 0.70 09/11/2016     HPI :*  66 y.o.femalewith medical history significant of HTN (not on any medications); who presents with complaints of 2 days of left-sided chest pain.Patient notes thattaking a deep inspiratory breath orany exertion such as walking up steps significantly worsens pain.EKG showed sinus bradycardia. Chest x-ray showed no acute abnormalities. TRH called to admit for chest pain rule out. D-dimer negative,  serial cardiac enzymes negative Triglycerides 310-  Cardiology consult for risk stratification,requested   HOSPITAL COURSE:   1. Chest pain - troponin x 3 negative - EKG NSR, without signs of ischemia - D dimer was negative (< 0.27) - CXR without acute findings The pt describes chest pain with both typical and atypical features. Her troponins have been negative and her EKG is without ischemic changes. Echocardiogram within normal limits. Labs are  WNL with the exception of elevated triglycerides. HTN and obesity are her only risk factors. She does not have a family history of heart disease. Status post cardiac cath on 4/26.No significant coronary artery disease. Patient will be continued on baby aspirin and statin. She has also been initiated on Prilosec  2. HTN - pt states she does not take medications at home for blood pressure - sBP is running in the 140-160s yesterday Blood pressure significantly better today Recommend outpatient monitoring of blood pressure before starting antihypertensive medication   3. High triglycerides - triglycerides 310. Patient has been started on a statin   Discharge Exam:   Blood pressure 123/80, pulse 63, temperature 98.1 F (36.7 C), temperature source Oral, resp. rate 18, height 5' 4"  (1.626 m), weight 68.1 kg (150 lb 1.6 oz), SpO2 100 %.   General: Pleasant, NAD Psych: Normal affect. Neuro: Alert and oriented X 3. Moves all extremities spontaneously. HEENT: Normal           Neck: Supple without bruits or JVD. Lungs:  Resp regular and unlabored, CTA. Heart: RRR no s3, s4, or murmurs. Abdomen: Soft, non-tender, non-distended, BS + x 4.  Extremities: No clubbing, cyanosis or edema. DP/PT/Radials 2+ and equal bilaterally.   Follow-up Information    Primary care provider. Call.   Why:  Hospital follow-up          Signed: Reyne Dumas 09/12/2016, 8:22 AM        Time spent >45 mins

## 2016-09-15 ENCOUNTER — Encounter (HOSPITAL_COMMUNITY): Payer: Self-pay | Admitting: Interventional Cardiology

## 2016-09-25 ENCOUNTER — Encounter: Payer: Self-pay | Admitting: Physician Assistant

## 2016-09-25 ENCOUNTER — Ambulatory Visit: Payer: Self-pay | Attending: Internal Medicine | Admitting: Physician Assistant

## 2016-09-25 VITALS — BP 115/77 | HR 67 | Temp 97.7°F | Resp 16 | Wt 149.0 lb

## 2016-09-25 DIAGNOSIS — E781 Pure hyperglyceridemia: Secondary | ICD-10-CM | POA: Insufficient documentation

## 2016-09-25 DIAGNOSIS — I10 Essential (primary) hypertension: Secondary | ICD-10-CM | POA: Insufficient documentation

## 2016-09-25 DIAGNOSIS — R030 Elevated blood-pressure reading, without diagnosis of hypertension: Secondary | ICD-10-CM

## 2016-09-25 DIAGNOSIS — R079 Chest pain, unspecified: Secondary | ICD-10-CM | POA: Insufficient documentation

## 2016-09-25 NOTE — Progress Notes (Signed)
Patient ID: Dana Mora, female   DOB: May 02, 1951, 66 y.o.   MRN: [id number] After being hospitalized 09/10/2016-09/12/2016 for CP work-up after presenting to the ED with a 2 day h/o CP.  The pt describes chest pain with both typical and atypical features. Her troponins have been negative and her EKG is without ischemic changes. Echocardiogram within normal limits. Labs are WNL with the exception of elevated triglycerides. HTN and obesity are her only risk factors. She does not have a family history of heart disease. Status post cardiac cath on 4/26.No significant coronary artery disease. Patient will be continued on baby aspirin and statin. She has also been initiated on Prilosec  Today she presents feeling good overall.  She has taken Ntg once since hospitalization which relieved a short episode of CP.  Her daughter is with her translating and she speaks some english.  She says she has had htn on and off for years and gets high readings out of the office. She is from [place] and looking to move to the [place] to live with family.  Denies any CP/Dizziness/SOB today  Hospital findings:  EKG showed sinus bradycardia. Chest x-ray showed no acute abnormalities.   echocardiogram  LV EF: 60% - 65%  ------------------------------------------------------------------- History: PMH: chest pain. Dyspnea. Risk factors: Hypertension.  ------------------------------------------------------------------- Study Conclusions  - Left ventricle: The cavity size was normal. Systolic function was normal. The estimated ejection fraction was in the range of 60% to 65%. Wall motion was normal; there were no regional wall motion abnormalities. Features are consistent with a pseudonormal left ventricular filling pattern, with concomitant abnormal relaxation and increased filling pressure (grade 2 diastolic dysfunction). - Aortic valve: Moderately calcified annulus. Trileaflet; normal thickness  leaflets. - Mitral valve: Calcified annulus. - Atrial septum: There was increased thickness of the septum, consistent with lipomatous hypertrophy. - Tricuspid valve: There was trivial regurgitation. - Pulmonary arteries: Systolic pressure could not be accurately estimated.  EKG showed sinus bradycardia. Chest x-ray showed no acute abnormalities.   From D/C summary: 1. Chest pain - troponin x 3 negative - EKG NSR, without signs of ischemia - D dimer was negative (< 0.27) - CXR without acute findings The pt describes chest pain with both typical and atypical features. Her troponins have been negative and her EKG is without ischemic changes. Echocardiogram within normal limits. Labs are WNL with the exception of elevated triglycerides. HTN and obesity are her only risk factors. She does not have a family history of heart disease. Status post cardiac cath on 4/26.No significant coronary artery disease. Patient will be continued on baby aspirin and statin. She has also been initiated on Prilosec  2. HTN - pt states she does not take medications at home for blood pressure - sBP is running in the 140-160s yesterday Blood pressure significantly better today Recommend outpatient monitoring of blood pressure before starting antihypertensive medication  3. High triglycerides - triglycerides 310. Patient has been started on a statin    Dana Mora, is a 66 y.o. female  QIO:[id number]  LKG:[id number]  DOB - 02/16/1951  Subjective:  Chief Complaint and HPI: Dana Mora is a 66 y.o. female here today to establish care and for a follow up visit.   ED/Hospital notes reviewed.   Social History: Family history:  ROS:   Constitutional:  No f/c, No night sweats, No unexplained weight loss. EENT:  No vision changes, No blurry vision, No hearing changes. No mouth, throat, or ear problems.  Respiratory: No cough, No SOB Cardiac: No  CP, no palpitations GI:  No abd pain,  No N/V/D. GU: No Urinary s/sx Musculoskeletal: No joint pain Neuro: No headache, no dizziness, no motor weakness.  Skin: No rash Endocrine:  No polydipsia. No polyuria.  Psych: Denies SI/HI  No problems updated.  ALLERGIES: No Known Allergies  PAST MEDICAL HISTORY: Past Medical History:  Diagnosis Date  . HTN (hypertension)     MEDICATIONS AT HOME: Prior to Admission medications   Medication Sig Start Date End Date Taking? Authorizing Provider  aspirin 81 MG chewable tablet Chew 1 tablet (81 mg total) by mouth daily. 09/12/16   Richarda OverlieAbrol, Nayana, MD  atorvastatin (LIPITOR) 20 MG tablet Take 1 tablet (20 mg total) by mouth every Monday, Wednesday, and Friday at 8 PM. 09/12/16   Richarda OverlieAbrol, Nayana, MD  Multiple Vitamin (MULTIVITAMIN WITH MINERALS) TABS tablet Take 1 tablet by mouth daily.    [provider]  nitroGLYCERIN (NITROSTAT) 0.4 MG SL tablet Place 1 tablet (0.4 mg total) under the tongue every 5 (five) minutes as needed for chest pain. 09/12/16   Richarda OverlieAbrol, Nayana, MD  omeprazole (PRILOSEC) 20 MG capsule Take 1 capsule (20 mg total) by mouth daily. 09/12/16   Richarda OverlieAbrol, Nayana, MD     Objective:  EXAM:   Vitals:   09/25/16 1445  BP: 115/77  Pulse: 67  Resp: 16  Temp: 97.7 F (36.5 C)  TempSrc: Oral  SpO2: 96%  Weight: 149 lb (67.6 kg)    General appearance : A&OX3. NAD. Non-toxic-appearing HEENT: Atraumatic and Normocephalic.  PERRLA. EOM intact.  TM clear B. Mouth-MMM, post pharynx WNL w/o erythema, No PND. Neck: supple, no JVD. No cervical lymphadenopathy. No thyromegaly Chest/Lungs:  Breathing-non-labored, Good air entry bilaterally, breath sounds normal without rales, rhonchi, or wheezing  CVS: S1 S2 regular, no murmurs, gallops, rubs  Abdomen: Bowel sounds present, Non tender and not distended with no gaurding, rigidity or rebound. Extremities: Bilateral Lower Ext shows no edema, both legs are warm to touch with = pulse throughout Neurology:  CN II-XII grossly  intact, Non focal.   Psych:  TP linear. J/I WNL. Normal speech. Appropriate eye contact and affect.  Skin:  No Rash  Data Review No results found for: HGBA1C   Assessment & Plan   1. Elevated BP without diagnosis of hypertension BP normal here today and at hospital during discharge.  They will monitor at home daily and send me a BP log in about 2-3 weeks.  If warranted at that time, consider HCTZ.    2. Chest pain, unspecified type-negative work up Continue with aspirin and Prilosec.  Ntg as needed and CP warnings to ED   3. High triglycerides Continue statin 3 days per week and recheck in about 10 weeks  Patient have been counseled extensively about nutrition and exercise  Return in about 10 weeks (around 12/04/2016) for assign PCP and recheck fasting lipids and BP.  The patient was given clear instructions to go to ER or return to medical center if symptoms don't improve, worsen or new problems develop. The patient verbalized understanding. The patient was told to call to get lab results if they haven't heard anything in the next week.     Georgian CoAngela Mack Thurmon, PA-C [facility] [place], [place] [phone]   09/25/2016, 5:45 PM

## 2016-09-25 NOTE — Patient Instructions (Signed)
Check blood pressures daily and record  Angina Pectoris Angina pectoris is a very bad feeling in the chest, neck, or arm. Your doctor may call it angina. There are four types of angina. Angina is caused by a lack of blood in the middle and thickest layer of the heart wall (myocardium). Angina may feel like a crushing or squeezing pain in the chest. It may feel like tightness or heavy pressure in the chest. Some people say it feels like gas, heartburn, or indigestion. Some people have symptoms other than pain. These include:  Shortness of breath.  Cold sweats.  Feeling sick to your stomach (nausea).  Feeling light-headed. Many women have chest discomfort and some of the other symptoms. However, women often have different symptoms, such as:  Feeling tired (fatigue).  Feeling nervous for no reason.  Feeling weak for no reason.  Dizziness or fainting. Women may have angina without any symptoms. Follow these instructions at home:  Take medicines only as told by your doctor.  Take care of other health issues as told by your doctor. These include:  High blood pressure (hypertension).  Diabetes.  Follow a heart-healthy diet. Your doctor can help you to choose healthy food options and make changes.  Talk to your doctor to learn more about healthy cooking methods and use them. These include:  Roasting.  Grilling.  Broiling.  Baking.  Poaching.  Steaming.  Stir-frying.  Follow an exercise program approved by your doctor.  Keep a healthy weight. Lose weight as told by your doctor.  Rest when you are tired.  Learn to manage stress.  Do not use any tobacco, such as cigarettes, chewing tobacco, or electronic cigarettes. If you need help quitting, ask your doctor.  If you drink alcohol, and your doctor says it is okay, limit yourself to no more than 1 drink per day. One drink equals 12 ounces of beer, 5 ounces of wine, or 1 ounces of hard liquor.  Stop illegal drug  use.  Keep all follow-up visits as told by your doctor. This is important. Do not take these medicines unless your doctor says that you can:  Nonsteroidal anti-inflammatory drugs (NSAIDs). These include:  Ibuprofen.  Naproxen.  Celecoxib.  Vitamin supplements that have vitamin A, vitamin E, or both.  Hormone therapy that contains estrogen with or without progestin. Get help right away if:  You have pain in your chest, neck, arm, jaw, stomach, or back that:  Lasts more than a few minutes.  Comes back.  Does not get better after you take medicine under your tongue (sublingual nitroglycerin).  You have any of these symptoms for no reason:  Gas, heartburn, or indigestion.  Sweating a lot.  Shortness of breath or trouble breathing.  Feeling sick to your stomach or throwing up.  Feeling more tired than usual.  Feeling nervous or worrying more than usual.  Feeling weak.  Diarrhea.  You are suddenly dizzy or light-headed.  You faint or pass out. These symptoms may be an emergency. Do not wait to see if the symptoms will go away. Get medical help right away. Call your local emergency services (911 in the [place] Do not drive yourself to the hospital. This information is not intended to replace advice given to you by your health care provider. Make sure you discuss any questions you have with your health care provider. Document Released: 10/22/2007 Document Revised: 10/11/2015 Document Reviewed: 09/06/2013 Elsevier Interactive Patient Education  2017 [employer]

## 2016-11-27 ENCOUNTER — Other Ambulatory Visit: Payer: Self-pay | Admitting: Physician Assistant

## 2016-11-27 DIAGNOSIS — I10 Essential (primary) hypertension: Secondary | ICD-10-CM

## 2016-11-27 MED ORDER — HYDROCHLOROTHIAZIDE 25 MG PO TABS: 25.0000 mg | ORAL_TABLET | Freq: Every day | ORAL | 3 refills | Status: DC

## 2016-11-27 NOTE — Progress Notes (Signed)
Spoke with son-in-law of patient.  I had told them to call if BP were running high at home.  Son-in-law is a Engineer, civil (consulting)nurse.  Bps running 145/95 daily on average at home.  I am sending Rx for HCTZ and they will schedule an appt in ~3weeks for labs and to be assigned PCP.  They will continue daily BP checks at home.

## 2016-12-01 ENCOUNTER — Ambulatory Visit: Payer: Self-pay | Admitting: Internal Medicine

## 2017-01-22 ENCOUNTER — Other Ambulatory Visit: Payer: Self-pay | Admitting: Physician Assistant

## 2018-12-25 IMAGING — DX DG CHEST 2V
3 series · 3 of 3 positions shown · non-contrast
Comparison: None.

CLINICAL DATA: Left-sided chest pain or 2 days

EXAM:
CHEST  2 VIEW

[chest pa]
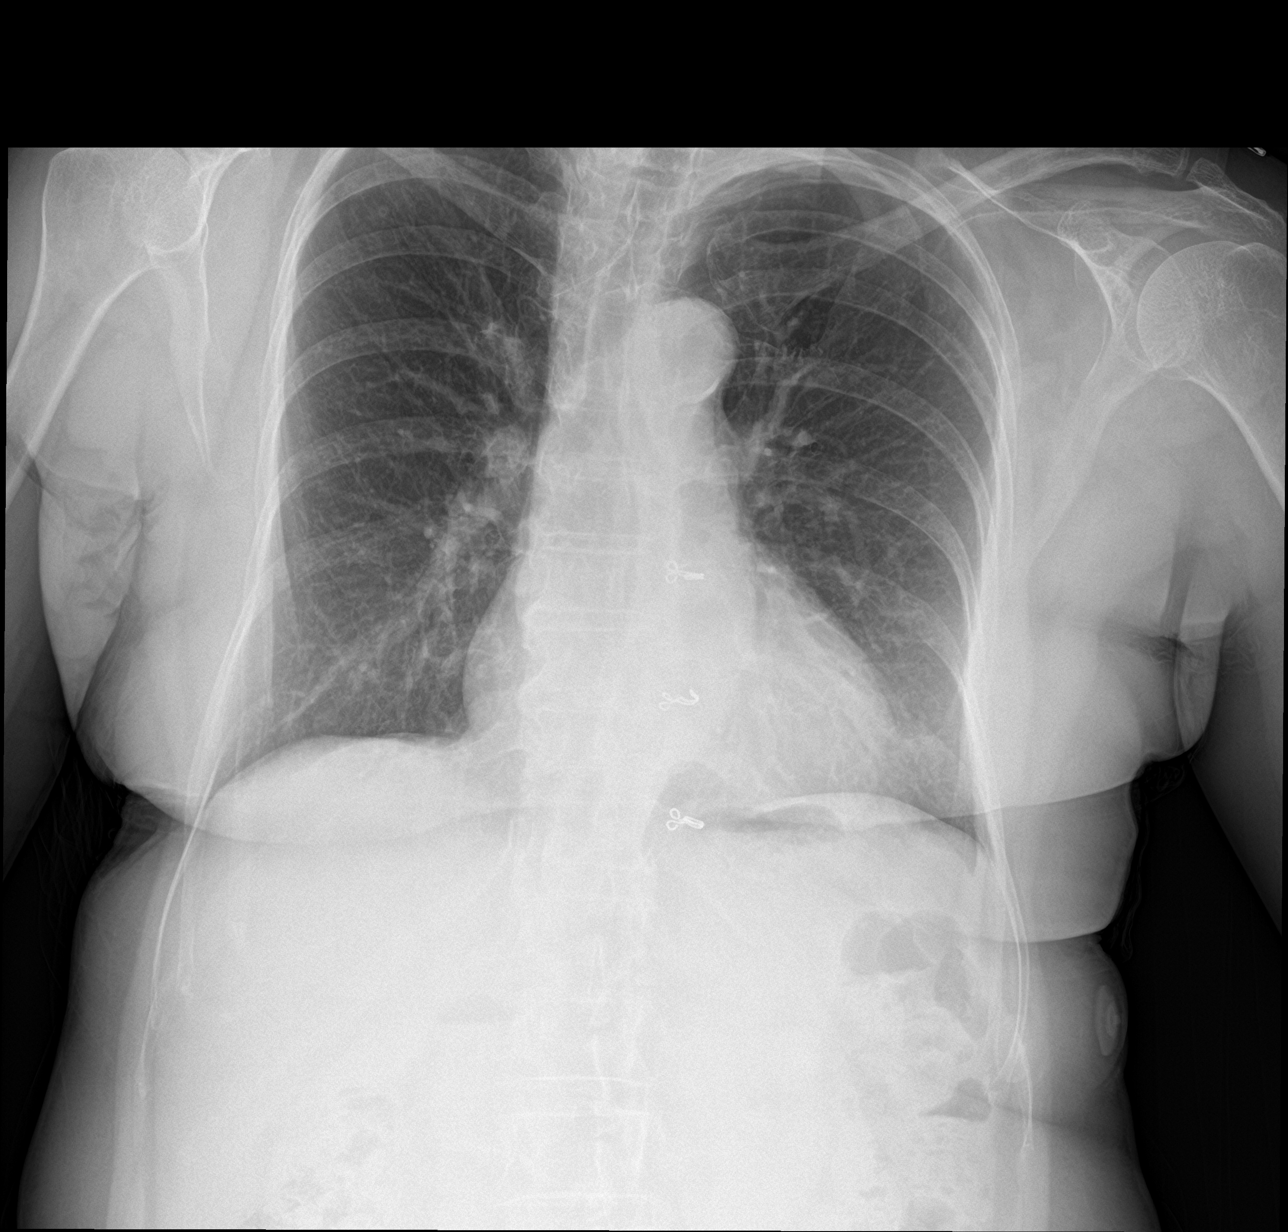

[chest lat (1 of 2)]
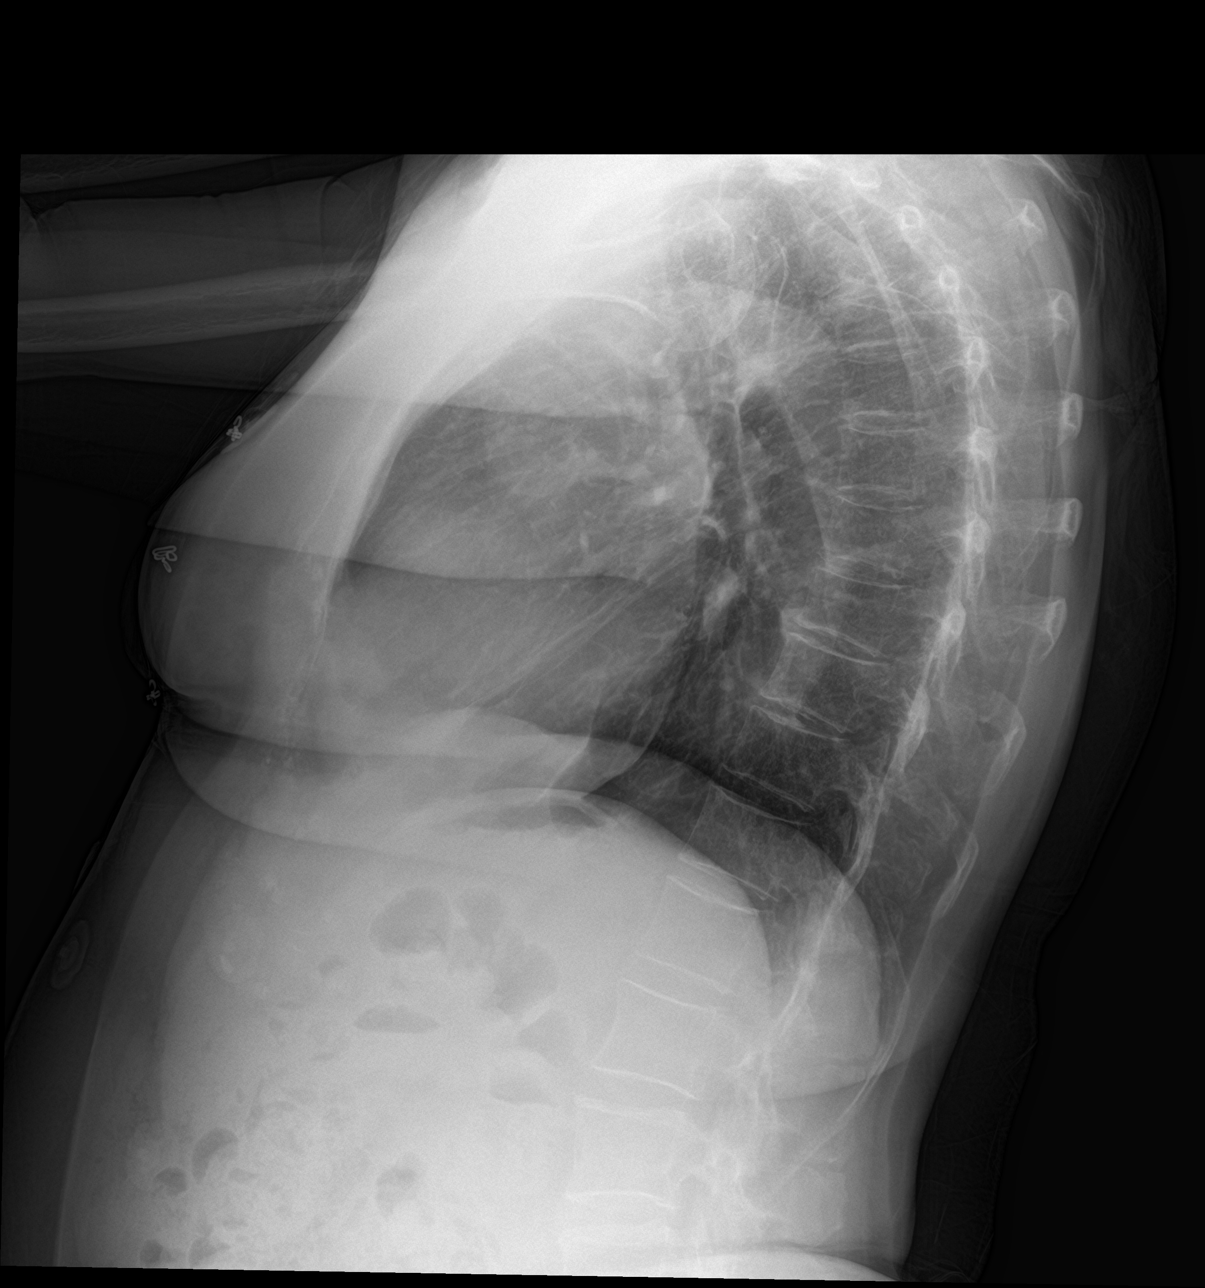

[chest lat (2 of 2)]
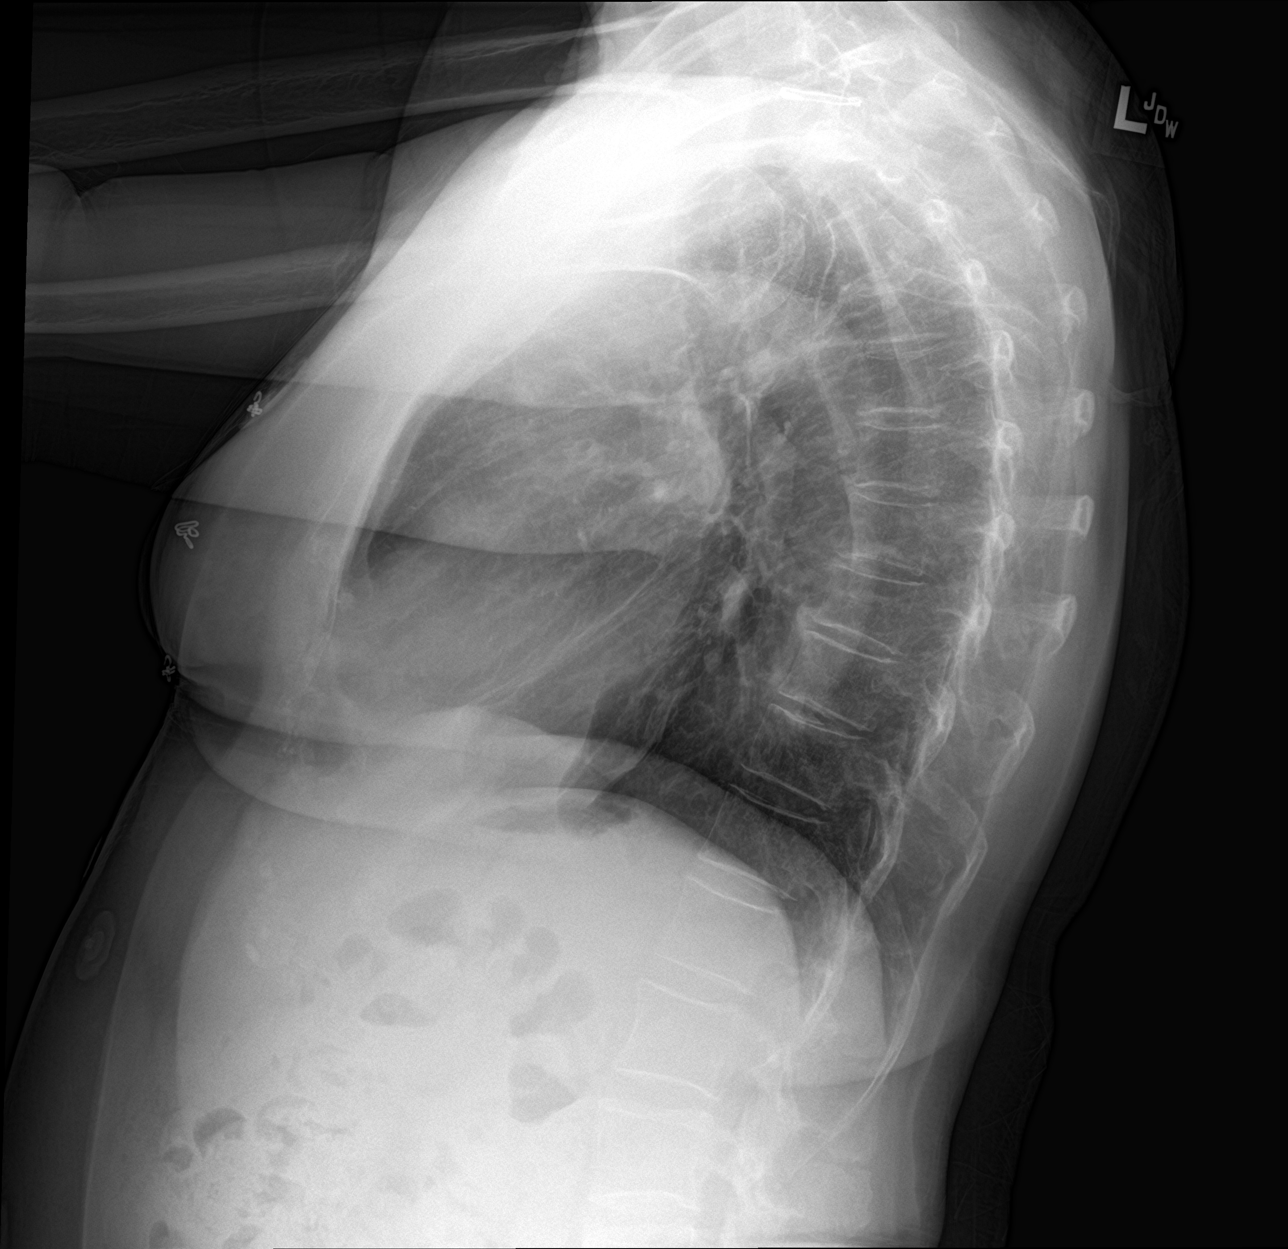

[3 of 3 positions shown; findings below may reference images not displayed]

FINDINGS: Cardiac shadow is mildly enlarged. Aortic calcifications are again
seen. The lungs are well aerated bilaterally without focal
infiltrate or sizable effusion. No acute bony abnormality is noted.
IMPRESSION: No active cardiopulmonary disease.

## 2022-03-14 NOTE — Progress Notes (Deleted)
Pt's son-in-law ([employer] employee) visited [facility] pick up orange card and CAFA apps and schedule appt w/ financial counselor on pt's behalf. No personal info was disclosed a/b the pt. Appt scheduled for 03/20/2022 at 3pm.

## 2022-03-20 ENCOUNTER — Ambulatory Visit: Payer: Self-pay | Attending: Internal Medicine

## 2022-05-06 ENCOUNTER — Ambulatory Visit: Payer: Self-pay

## 2022-06-18 ENCOUNTER — Ambulatory Visit: Payer: Self-pay | Attending: Nurse Practitioner | Admitting: Nurse Practitioner

## 2022-06-18 ENCOUNTER — Encounter: Payer: Self-pay | Admitting: Nurse Practitioner

## 2022-06-18 VITALS — BP 112/76 | HR 89 | Resp 98 | Ht 64.0 in | Wt 157.2 lb

## 2022-06-18 DIAGNOSIS — Z9189 Other specified personal risk factors, not elsewhere classified: Secondary | ICD-10-CM

## 2022-06-18 DIAGNOSIS — I10 Essential (primary) hypertension: Secondary | ICD-10-CM

## 2022-06-18 DIAGNOSIS — Z7689 Persons encountering health services in other specified circumstances: Secondary | ICD-10-CM

## 2022-06-18 DIAGNOSIS — E559 Vitamin D deficiency, unspecified: Secondary | ICD-10-CM

## 2022-06-18 DIAGNOSIS — E781 Pure hyperglyceridemia: Secondary | ICD-10-CM

## 2022-06-18 DIAGNOSIS — K219 Gastro-esophageal reflux disease without esophagitis: Secondary | ICD-10-CM

## 2022-06-18 DIAGNOSIS — R0789 Other chest pain: Secondary | ICD-10-CM

## 2022-06-18 DIAGNOSIS — H409 Unspecified glaucoma: Secondary | ICD-10-CM

## 2022-06-18 DIAGNOSIS — R7309 Other abnormal glucose: Secondary | ICD-10-CM

## 2022-06-18 MED ORDER — ASPIRIN 81 MG PO CHEW: 81.0000 mg | CHEWABLE_TABLET | Freq: Every day | ORAL | 3 refills | Status: AC

## 2022-06-18 MED ORDER — HYDROCHLOROTHIAZIDE 25 MG PO TABS: 25.0000 mg | ORAL_TABLET | Freq: Every day | ORAL | 3 refills | Status: AC

## 2022-06-18 MED ORDER — NITROGLYCERIN 0.4 MG SL SUBL: 0.4000 mg | SUBLINGUAL_TABLET | SUBLINGUAL | 12 refills | Status: AC | PRN

## 2022-06-18 MED ORDER — ATORVASTATIN CALCIUM 20 MG PO TABS: ORAL_TABLET | ORAL | 1 refills | Status: AC

## 2022-06-18 MED ORDER — BRIMONIDINE TARTRATE 0.2 % OP SOLN: OPHTHALMIC | 6 refills | Status: AC

## 2022-06-18 MED ORDER — OMEPRAZOLE 20 MG PO CPDR: 20.0000 mg | DELAYED_RELEASE_CAPSULE | Freq: Every day | ORAL | 1 refills | Status: AC

## 2022-06-18 MED ORDER — AMLODIPINE BESYLATE 5 MG PO TABS: 5.0000 mg | ORAL_TABLET | Freq: Every day | ORAL | 1 refills | Status: AC

## 2022-06-18 NOTE — Progress Notes (Signed)
No concerns Daughter in room- Shilpa

## 2022-06-18 NOTE — Progress Notes (Signed)
Assessment & Plan:  Dana Mora was seen today for establish care.  Diagnoses and all orders for this visit:  Encounter to establish care  Primary hypertension -     CMP14+EGFR -     amLODipine (NORVASC) 5 MG tablet; Take 1 tablet (5 mg total) by mouth daily. -     hydrochlorothiazide (HYDRODIURIL) 25 MG tablet; Take 1 tablet (25 mg total) by mouth daily. Continue all antihypertensives as prescribed.  Reminded to bring in blood pressure log for follow  up appointment.  RECOMMENDATIONS: DASH/Mediterranean Diets are healthier choices for HTN.    At risk for bone density loss -     DG Bone Density; Future  Elevated glucose -     CBC with Differential  Hypertriglyceridemia without hypercholesterolemia -     Lipid panel -     atorvastatin (LIPITOR) 20 MG tablet; TAKE ONE TABLET ON MONDAYS, WEDNESDAYS, AND FRIDAYS AT 8PM  Atypical chest pain -     aspirin 81 MG chewable tablet; Chew 1 tablet (81 mg total) by mouth daily. -     nitroGLYCERIN (NITROSTAT) 0.4 MG SL tablet; Place 1 tablet (0.4 mg total) under the tongue every 5 (five) minutes as needed for chest pain.  Vitamin D deficiency disease -     VITAMIN D 25 Hydroxy (Vit-D Deficiency, Fractures)  GERD without esophagitis -     omeprazole (PRILOSEC) 20 MG capsule; Take 1 capsule (20 mg total) by mouth daily. INSTRUCTIONS: Avoid GERD Triggers: acidic, spicy or fried foods, caffeine, coffee, sodas,  alcohol and chocolate.    Glaucoma, unspecified glaucoma type, unspecified laterality -     brimonidine (ALPHAGAN) 0.2 % ophthalmic solution; Place one drop into both eyes 3 (three) times a day.    Patient has been counseled on age-appropriate routine health concerns for screening and prevention. These are reviewed and up-to-date. Referrals have been placed accordingly. Immunizations are up-to-date or declined.    Subjective:   Chief Complaint  Patient presents with   Establish Care   HPI Dana Mora 72 y.o. female  presents to office today to establish care.  She is accompanied by her daughter.  She has a past medical history of hypertension, hypertriglyceridemia without hypercholesterolemia, GERD without esophagitis, glaucoma   Blood pressure is well-controlled today with amlodipine 5 mg daily and hydrochlorothiazide 25 mg daily. BP Readings from Last 3 Encounters:  06/18/22 112/76  09/25/16 115/77  09/12/16 123/80     Patient has been counseled on age-appropriate routine health concerns for screening and prevention. These are reviewed and up-to-date. Referrals have been placed accordingly. Immunizations are up-to-date or declined.     MAMMOGRAM: declines HEP C: Declines COLONOSCOPY: declines BONE DENSITY: referral placed.   Review of Systems  Constitutional:  Negative for fever, malaise/fatigue and weight loss.  HENT: Negative.  Negative for nosebleeds.   Eyes: Negative.  Negative for blurred vision, double vision and photophobia.  Respiratory: Negative.  Negative for cough and shortness of breath.   Cardiovascular: Negative.  Negative for chest pain, palpitations and leg swelling.  Gastrointestinal:  Positive for heartburn. Negative for nausea and vomiting.  Musculoskeletal: Negative.  Negative for myalgias.  Neurological: Negative.  Negative for dizziness, focal weakness, seizures and headaches.  Psychiatric/Behavioral: Negative.  Negative for suicidal ideas.     Past Medical History:  Diagnosis Date   Glaucoma    HTN (hypertension)    Hyperlipidemia     Past Surgical History:  Procedure Laterality Date   LEFT HEART CATH AND CORONARY  ANGIOGRAPHY N/A 09/11/2016   Procedure: Left Heart Cath and Coronary Angiography;  Surgeon: Jettie Booze, MD;  Location: [facility];  Service: Cardiovascular;  Laterality: N/A;    Family History  Problem Relation Age of Onset   Hypertension Father     Social History Reviewed with no changes to be made today.   Outpatient  Medications Prior to Visit  Medication Sig Dispense Refill   Multiple Vitamin (MULTIVITAMIN WITH MINERALS) TABS tablet Take 1 tablet by mouth daily.     Vitamins A & D (VITAMIN A & D) 5000-400 units CAPS Take by mouth.     amLODipine (NORVASC) 5 MG tablet Take by mouth.     aspirin 81 MG chewable tablet Chew 1 tablet (81 mg total) by mouth daily. 30 tablet 1   brimonidine (ALPHAGAN) 0.2 % ophthalmic solution Place one drop into both eyes 3 (three) times a day.     nitroGLYCERIN (NITROSTAT) 0.4 MG SL tablet Place 1 tablet (0.4 mg total) under the tongue every 5 (five) minutes as needed for chest pain. 30 tablet 12   atorvastatin (LIPITOR) 20 MG tablet TAKE ONE TABLET ON MONDAYS, WEDNESDAYS, AND FRIDAYS AT 8PM (Patient not taking: Reported on 06/18/2022) 30 tablet 0   hydrochlorothiazide (HYDRODIURIL) 25 MG tablet Take 1 tablet (25 mg total) by mouth daily. (Patient not taking: Reported on 06/18/2022) 90 tablet 3   omeprazole (PRILOSEC) 20 MG capsule Take 1 capsule (20 mg total) by mouth daily. (Patient not taking: Reported on 06/18/2022) 30 capsule 1   No facility-administered medications prior to visit.    No Known Allergies     Objective:    BP 112/76   Pulse 89   Resp (!) 98   Ht 5\' 4"  (5.956 m)   Wt 157 lb 3.2 oz (71.3 kg)   BMI 26.98 kg/m  Wt Readings from Last 3 Encounters:  06/18/22 157 lb 3.2 oz (71.3 kg)  09/25/16 149 lb (67.6 kg)  09/11/16 150 lb 1.6 oz (68.1 kg)    Physical Exam Vitals and nursing note reviewed.  Constitutional:      Appearance: She is well-developed.  HENT:     Head: Normocephalic and atraumatic.  Cardiovascular:     Rate and Rhythm: Normal rate and regular rhythm.     Heart sounds: Normal heart sounds. No murmur heard.    No friction rub. No gallop.  Pulmonary:     Effort: Pulmonary effort is normal. No tachypnea or respiratory distress.     Breath sounds: Normal breath sounds. No decreased breath sounds, wheezing, rhonchi or rales.  Chest:      Chest wall: No tenderness.  Abdominal:     General: Bowel sounds are normal.     Palpations: Abdomen is soft.  Musculoskeletal:        General: Normal range of motion.     Cervical back: Normal range of motion.  Skin:    General: Skin is warm and dry.  Neurological:     Mental Status: She is alert and oriented to person, place, and time.     Coordination: Coordination normal.  Psychiatric:        Behavior: Behavior normal. Behavior is cooperative.        Thought Content: Thought content normal.        Judgment: Judgment normal.          Patient has been counseled extensively about nutrition and exercise as well as the importance of adherence with medications and  regular follow-up. The patient was given clear instructions to go to ER or return to medical center if symptoms don't improve, worsen or new problems develop. The patient verbalized understanding.   Follow-up: Return in about 6 months (around 12/17/2022).   Gildardo Pounds, FNP-BC [facility] [place], [place]   06/18/2022, 12:11 PM

## 2022-06-19 ENCOUNTER — Ambulatory Visit: Payer: Self-pay | Attending: Nurse Practitioner

## 2022-06-19 LAB — CBC WITH DIFFERENTIAL/PLATELET
Basophils Absolute: 0 10*3/uL (ref 0.0–0.2)
Basos: 1 %
EOS (ABSOLUTE): 0.1 10*3/uL (ref 0.0–0.4)
Eos: 3 %
Hematocrit: 41.7 % (ref 34.0–46.6)
Hemoglobin: 13.8 g/dL (ref 11.1–15.9)
Immature Grans (Abs): 0 10*3/uL (ref 0.0–0.1)
Immature Granulocytes: 0 %
Lymphocytes Absolute: 2.7 10*3/uL (ref 0.7–3.1)
Lymphs: 51 %
MCH: 28.6 pg (ref 26.6–33.0)
MCHC: 33.1 g/dL (ref 31.5–35.7)
MCV: 86 fL (ref 79–97)
Monocytes Absolute: 0.4 10*3/uL (ref 0.1–0.9)
Monocytes: 8 %
Neutrophils Absolute: 1.9 10*3/uL (ref 1.4–7.0)
Neutrophils: 37 %
Platelets: 269 10*3/uL (ref 150–450)
RBC: 4.83 x10E6/uL (ref 3.77–5.28)
RDW: 12.1 % (ref 11.7–15.4)
WBC: 5.1 10*3/uL (ref 3.4–10.8)

## 2022-06-19 LAB — CMP14+EGFR
ALT: 56 IU/L — ABNORMAL HIGH (ref 0–32)
AST: 38 IU/L (ref 0–40)
Albumin/Globulin Ratio: 1.6 (ref 1.2–2.2)
Albumin: 4.6 g/dL (ref 3.9–4.9)
Alkaline Phosphatase: 60 IU/L (ref 44–121)
BUN/Creatinine Ratio: 12 (ref 12–28)
BUN: 8 mg/dL (ref 8–27)
Bilirubin Total: 1.3 mg/dL — ABNORMAL HIGH (ref 0.0–1.2)
CO2: 23 mmol/L (ref 20–29)
Calcium: 9.8 mg/dL (ref 8.7–10.3)
Chloride: 100 mmol/L (ref 96–106)
Creatinine, Ser: 0.69 mg/dL (ref 0.57–1.00)
Globulin, Total: 2.9 g/dL (ref 1.5–4.5)
Glucose: 91 mg/dL (ref 70–99)
Potassium: 3.8 mmol/L (ref 3.5–5.2)
Sodium: 140 mmol/L (ref 134–144)
Total Protein: 7.5 g/dL (ref 6.0–8.5)
eGFR: 94 mL/min/{1.73_m2} (ref >59)

## 2022-06-19 LAB — VITAMIN D 25 HYDROXY (VIT D DEFICIENCY, FRACTURES): Vit D, 25-Hydroxy: 34.4 ng/mL (ref 30.0–100.0)

## 2022-06-19 LAB — LIPID PANEL
Chol/HDL Ratio: 4.5 ratio — ABNORMAL HIGH (ref 0.0–4.4)
Cholesterol, Total: 204 mg/dL — ABNORMAL HIGH (ref 100–199)
HDL: 45 mg/dL (ref >39)
LDL Chol Calc (NIH): 123 mg/dL — ABNORMAL HIGH (ref 0–99)
Triglycerides: 202 mg/dL — ABNORMAL HIGH (ref 0–149)
VLDL Cholesterol Cal: 36 mg/dL (ref 5–40)

## 2022-06-20 ENCOUNTER — Telehealth: Payer: Self-pay | Admitting: Nurse Practitioner

## 2022-06-20 NOTE — Telephone Encounter (Signed)
Called to inform that pt's [place] is ready for pick up.

## 2022-07-16 ENCOUNTER — Telehealth: Payer: Self-pay

## 2022-07-16 NOTE — Telephone Encounter (Signed)
Telephoned daughter Melrose Nakayama) and left a voice message with [facility] (scholarship) contact information.

## 2022-12-17 ENCOUNTER — Ambulatory Visit: Payer: Self-pay | Attending: Nurse Practitioner | Admitting: Nurse Practitioner

## 2023-02-19 ENCOUNTER — Ambulatory Visit: Payer: Self-pay | Admitting: Physician Assistant

## 2023-08-31 ENCOUNTER — Other Ambulatory Visit: Payer: Self-pay | Admitting: *Deleted

## 2023-09-18 ENCOUNTER — Emergency Department (HOSPITAL_COMMUNITY)

## 2023-09-18 ENCOUNTER — Other Ambulatory Visit: Payer: Self-pay

## 2023-09-18 ENCOUNTER — Inpatient Hospital Stay (HOSPITAL_COMMUNITY)
Admission: EM | Admit: 2023-09-18 | Discharge: 2023-09-21 | DRG: 641 | Disposition: A | Discharge status: Home / Self Care | Attending: Internal Medicine | Admitting: Internal Medicine

## 2023-09-18 ENCOUNTER — Encounter (HOSPITAL_COMMUNITY): Payer: Self-pay

## 2023-09-18 DIAGNOSIS — K224 Dyskinesia of esophagus: Secondary | ICD-10-CM | POA: Diagnosis present

## 2023-09-18 DIAGNOSIS — R55 Syncope and collapse: Secondary | ICD-10-CM | POA: Diagnosis not present

## 2023-09-18 DIAGNOSIS — Z853 Personal history of malignant neoplasm of breast: Secondary | ICD-10-CM

## 2023-09-18 DIAGNOSIS — Z9011 Acquired absence of right breast and nipple: Secondary | ICD-10-CM

## 2023-09-18 DIAGNOSIS — E785 Hyperlipidemia, unspecified: Secondary | ICD-10-CM | POA: Diagnosis present

## 2023-09-18 DIAGNOSIS — C50919 Malignant neoplasm of unspecified site of unspecified female breast: Secondary | ICD-10-CM | POA: Insufficient documentation

## 2023-09-18 DIAGNOSIS — D649 Anemia, unspecified: Secondary | ICD-10-CM | POA: Diagnosis present

## 2023-09-18 DIAGNOSIS — Y92000 Kitchen of unspecified non-institutional (private) residence as  the place of occurrence of the external cause: Secondary | ICD-10-CM

## 2023-09-18 DIAGNOSIS — M546 Pain in thoracic spine: Secondary | ICD-10-CM

## 2023-09-18 DIAGNOSIS — E876 Hypokalemia: Secondary | ICD-10-CM | POA: Diagnosis not present

## 2023-09-18 DIAGNOSIS — I10 Essential (primary) hypertension: Secondary | ICD-10-CM | POA: Diagnosis not present

## 2023-09-18 DIAGNOSIS — Z79899 Other long term (current) drug therapy: Secondary | ICD-10-CM

## 2023-09-18 DIAGNOSIS — M4854XA Collapsed vertebra, not elsewhere classified, thoracic region, initial encounter for fracture: Secondary | ICD-10-CM | POA: Diagnosis present

## 2023-09-18 DIAGNOSIS — I959 Hypotension, unspecified: Secondary | ICD-10-CM | POA: Diagnosis present

## 2023-09-18 DIAGNOSIS — Z5982 Transportation insecurity: Secondary | ICD-10-CM

## 2023-09-18 DIAGNOSIS — D72829 Elevated white blood cell count, unspecified: Secondary | ICD-10-CM | POA: Diagnosis present

## 2023-09-18 DIAGNOSIS — H9212 Otorrhea, left ear: Secondary | ICD-10-CM | POA: Diagnosis present

## 2023-09-18 DIAGNOSIS — I3139 Other pericardial effusion (noninflammatory): Secondary | ICD-10-CM | POA: Diagnosis present

## 2023-09-18 DIAGNOSIS — W01190A Fall on same level from slipping, tripping and stumbling with subsequent striking against furniture, initial encounter: Secondary | ICD-10-CM | POA: Diagnosis present

## 2023-09-18 DIAGNOSIS — E869 Volume depletion, unspecified: Principal | ICD-10-CM | POA: Diagnosis present

## 2023-09-18 HISTORY — DX: Malignant (primary) neoplasm, unspecified: C80.1

## 2023-09-18 HISTORY — DX: Essential (primary) hypertension: I10

## 2023-09-18 LAB — COMPREHENSIVE METABOLIC PANEL WITH GFR
ALT: 27 U/L (ref 0–44)
AST: 36 U/L (ref 15–41)
Albumin: 3.3 g/dL — ABNORMAL LOW (ref 3.5–5.0)
Alkaline Phosphatase: 32 U/L — ABNORMAL LOW (ref 38–126)
Anion gap: 10 (ref 5–15)
BUN: 22 mg/dL (ref 8–23)
CO2: 26 mmol/L (ref 22–32)
Calcium: 8.6 mg/dL — ABNORMAL LOW (ref 8.9–10.3)
Chloride: 101 mmol/L (ref 98–111)
Creatinine, Ser: 0.71 mg/dL (ref 0.44–1.00)
GFR, Estimated: >60 mL/min (ref >60)
Glucose, Bld: 119 mg/dL — ABNORMAL HIGH (ref 70–99)
Potassium: 3.2 mmol/L — ABNORMAL LOW (ref 3.5–5.1)
Sodium: 137 mmol/L (ref 135–145)
Total Bilirubin: 1.4 mg/dL — ABNORMAL HIGH (ref 0.0–1.2)
Total Protein: 6.2 g/dL — ABNORMAL LOW (ref 6.5–8.1)

## 2023-09-18 LAB — CBC WITH DIFFERENTIAL/PLATELET
Abs Immature Granulocytes: 0.19 10*3/uL — ABNORMAL HIGH (ref 0.00–0.07)
Basophils Absolute: 0 10*3/uL (ref 0.0–0.1)
Basophils Relative: 0 %
Eosinophils Absolute: 0 10*3/uL (ref 0.0–0.5)
Eosinophils Relative: 0 %
HCT: 31.1 % — ABNORMAL LOW (ref 36.0–46.0)
Hemoglobin: 10.1 g/dL — ABNORMAL LOW (ref 12.0–15.0)
Immature Granulocytes: 1 %
Lymphocytes Relative: 32 %
Lymphs Abs: 4.4 10*3/uL — ABNORMAL HIGH (ref 0.7–4.0)
MCH: 30.2 pg (ref 26.0–34.0)
MCHC: 32.5 g/dL (ref 30.0–36.0)
MCV: 93.1 fL (ref 80.0–100.0)
Monocytes Absolute: 0.5 10*3/uL (ref 0.1–1.0)
Monocytes Relative: 4 %
Neutro Abs: 8.4 10*3/uL — ABNORMAL HIGH (ref 1.7–7.7)
Neutrophils Relative %: 63 %
Platelets: 231 10*3/uL (ref 150–400)
RBC: 3.34 MIL/uL — ABNORMAL LOW (ref 3.87–5.11)
RDW: 14.6 % (ref 11.5–15.5)
WBC: 13.4 10*3/uL — ABNORMAL HIGH (ref 4.0–10.5)
nRBC: 0 % (ref 0.0–0.2)

## 2023-09-18 LAB — URINALYSIS, W/ REFLEX TO CULTURE (INFECTION SUSPECTED)
Bacteria, UA: NONE SEEN
Bilirubin Urine: NEGATIVE
Glucose, UA: 50 mg/dL — AB
Hgb urine dipstick: NEGATIVE
Ketones, ur: NEGATIVE mg/dL
Leukocytes,Ua: NEGATIVE
Nitrite: NEGATIVE
Protein, ur: NEGATIVE mg/dL
Specific Gravity, Urine: 1.009 (ref 1.005–1.030)
pH: 7 (ref 5.0–8.0)

## 2023-09-18 LAB — TROPONIN I (HIGH SENSITIVITY)
Troponin I (High Sensitivity): 10 ng/L (ref <18)
Troponin I (High Sensitivity): 9 ng/L (ref <18)

## 2023-09-18 MED ORDER — FENTANYL CITRATE PF 50 MCG/ML IJ SOSY
12.5000 ug | PREFILLED_SYRINGE | Freq: Once | INTRAMUSCULAR | Status: DC
  Filled 2023-09-18: qty 1

## 2023-09-18 MED ORDER — LACTATED RINGERS IV BOLUS
1000.0000 mL | Freq: Once | INTRAVENOUS | Status: AC
  Administered 2023-09-18: 1000 mL via INTRAVENOUS

## 2023-09-18 MED ORDER — ACETAMINOPHEN 325 MG PO TABS
650.0000 mg | ORAL_TABLET | Freq: Once | ORAL | Status: AC
  Administered 2023-09-18: 650 mg via ORAL
  Filled 2023-09-18: qty 2

## 2023-09-18 MED ORDER — ENOXAPARIN SODIUM 40 MG/0.4ML IJ SOSY
40.0000 mg | PREFILLED_SYRINGE | INTRAMUSCULAR | Status: DC
  Administered 2023-09-19 – 2023-09-21 (×3): 40 mg via SUBCUTANEOUS
  Filled 2023-09-18 (×3): qty 0.4

## 2023-09-18 MED ORDER — ATORVASTATIN CALCIUM 10 MG PO TABS
20.0000 mg | ORAL_TABLET | Freq: Every day | ORAL | Status: DC
  Administered 2023-09-19 – 2023-09-21 (×3): 20 mg via ORAL
  Filled 2023-09-18 (×3): qty 2

## 2023-09-18 MED ORDER — FENTANYL CITRATE PF 50 MCG/ML IJ SOSY
12.5000 ug | PREFILLED_SYRINGE | INTRAMUSCULAR | Status: DC | PRN
  Administered 2023-09-18 – 2023-09-19 (×2): 12.5 ug via INTRAVENOUS
  Filled 2023-09-18: qty 1

## 2023-09-18 MED ORDER — FENTANYL CITRATE PF 50 MCG/ML IJ SOSY
50.0000 ug | PREFILLED_SYRINGE | Freq: Once | INTRAMUSCULAR | Status: AC
  Administered 2023-09-18: 50 ug via INTRAVENOUS
  Filled 2023-09-18: qty 1

## 2023-09-18 MED ORDER — IOHEXOL 350 MG/ML SOLN
75.0000 mL | Freq: Once | INTRAVENOUS | Status: AC | PRN
  Administered 2023-09-18: 75 mL via INTRAVENOUS

## 2023-09-18 MED ORDER — HYDROCODONE-ACETAMINOPHEN 5-325 MG PO TABS
1.0000 | ORAL_TABLET | Freq: Once | ORAL | Status: AC
  Administered 2023-09-18: 1 via ORAL
  Filled 2023-09-18: qty 1

## 2023-09-18 MED ADMIN — Potassium Chloride Powder Packet 20 mEq: 20 meq | ORAL | NDC 00245036089

## 2023-09-18 MED FILL — Potassium Chloride Powder Packet 20 mEq: 20.0000 meq | ORAL | Qty: 1 | Status: AC

## 2023-09-19 ENCOUNTER — Observation Stay (HOSPITAL_COMMUNITY)

## 2023-09-19 DIAGNOSIS — R55 Syncope and collapse: Secondary | ICD-10-CM | POA: Diagnosis not present

## 2023-09-19 DIAGNOSIS — D72829 Elevated white blood cell count, unspecified: Secondary | ICD-10-CM | POA: Insufficient documentation

## 2023-09-19 LAB — COMPREHENSIVE METABOLIC PANEL WITH GFR
ALT: 28 U/L (ref 0–44)
AST: 35 U/L (ref 15–41)
Albumin: 3.4 g/dL — ABNORMAL LOW (ref 3.5–5.0)
Alkaline Phosphatase: 35 U/L — ABNORMAL LOW (ref 38–126)
Anion gap: 10 (ref 5–15)
BUN: 15 mg/dL (ref 8–23)
CO2: 27 mmol/L (ref 22–32)
Calcium: 8.9 mg/dL (ref 8.9–10.3)
Chloride: 96 mmol/L — ABNORMAL LOW (ref 98–111)
Creatinine, Ser: 0.53 mg/dL (ref 0.44–1.00)
GFR, Estimated: >60 mL/min (ref >60)
Glucose, Bld: 119 mg/dL — ABNORMAL HIGH (ref 70–99)
Potassium: 3.8 mmol/L (ref 3.5–5.1)
Sodium: 133 mmol/L — ABNORMAL LOW (ref 135–145)
Total Bilirubin: 1.7 mg/dL — ABNORMAL HIGH (ref 0.0–1.2)
Total Protein: 6.5 g/dL (ref 6.5–8.1)

## 2023-09-19 LAB — ECHOCARDIOGRAM COMPLETE
AR max vel: 2.61 cm2
AV Area VTI: 2.46 cm2
AV Area mean vel: 2.46 cm2
AV Mean grad: 4 mmHg
AV Peak grad: 6.8 mmHg
Ao pk vel: 1.3 m/s
Area-P 1/2: 3.53 cm2
Calc EF: 67.1 %
Height: 64 in
S' Lateral: 2.6 cm
Single Plane A2C EF: 67 %
Single Plane A4C EF: 67.6 %
Weight: 2303.37 [oz_av]

## 2023-09-19 LAB — CBC
HCT: 33.2 % — ABNORMAL LOW (ref 36.0–46.0)
Hemoglobin: 11.1 g/dL — ABNORMAL LOW (ref 12.0–15.0)
MCH: 30 pg (ref 26.0–34.0)
MCHC: 33.4 g/dL (ref 30.0–36.0)
MCV: 89.7 fL (ref 80.0–100.0)
Platelets: 167 10*3/uL (ref 150–400)
RBC: 3.7 MIL/uL — ABNORMAL LOW (ref 3.87–5.11)
RDW: 14.5 % (ref 11.5–15.5)
WBC: 4.2 10*3/uL (ref 4.0–10.5)
nRBC: 0 % (ref 0.0–0.2)

## 2023-09-19 LAB — TSH: TSH: 2.714 u[IU]/mL (ref 0.350–4.500)

## 2023-09-19 MED ORDER — HYDROCODONE-ACETAMINOPHEN 5-325 MG PO TABS
1.0000 | ORAL_TABLET | Freq: Four times a day (QID) | ORAL | Status: DC | PRN
  Administered 2023-09-19 – 2023-09-21 (×5): 1 via ORAL
  Filled 2023-09-19 (×5): qty 1

## 2023-09-19 MED ORDER — ENSURE ENLIVE PO LIQD
237.0000 mL | Freq: Two times a day (BID) | ORAL | Status: DC
  Administered 2023-09-19 – 2023-09-21 (×6): 237 mL via ORAL

## 2023-09-19 MED FILL — KCl 20 MEQ/L (0.15%) in Dextrose 5% & NaCl 0.9% Inj: INTRAVENOUS | Qty: 1000 | Status: AC

## 2023-09-20 ENCOUNTER — Encounter (HOSPITAL_COMMUNITY): Payer: Self-pay | Admitting: Internal Medicine

## 2023-09-20 DIAGNOSIS — R55 Syncope and collapse: Secondary | ICD-10-CM | POA: Diagnosis not present

## 2023-09-20 DIAGNOSIS — Z5982 Transportation insecurity: Secondary | ICD-10-CM | POA: Diagnosis not present

## 2023-09-20 DIAGNOSIS — I10 Essential (primary) hypertension: Secondary | ICD-10-CM | POA: Diagnosis present

## 2023-09-20 DIAGNOSIS — M4854XA Collapsed vertebra, not elsewhere classified, thoracic region, initial encounter for fracture: Secondary | ICD-10-CM | POA: Diagnosis present

## 2023-09-20 DIAGNOSIS — E785 Hyperlipidemia, unspecified: Secondary | ICD-10-CM | POA: Diagnosis present

## 2023-09-20 DIAGNOSIS — I3139 Other pericardial effusion (noninflammatory): Secondary | ICD-10-CM | POA: Diagnosis present

## 2023-09-20 DIAGNOSIS — D72829 Elevated white blood cell count, unspecified: Secondary | ICD-10-CM | POA: Diagnosis present

## 2023-09-20 DIAGNOSIS — Z9011 Acquired absence of right breast and nipple: Secondary | ICD-10-CM | POA: Diagnosis not present

## 2023-09-20 DIAGNOSIS — W01190A Fall on same level from slipping, tripping and stumbling with subsequent striking against furniture, initial encounter: Secondary | ICD-10-CM | POA: Diagnosis present

## 2023-09-20 DIAGNOSIS — K224 Dyskinesia of esophagus: Secondary | ICD-10-CM | POA: Diagnosis present

## 2023-09-20 DIAGNOSIS — E876 Hypokalemia: Secondary | ICD-10-CM | POA: Diagnosis present

## 2023-09-20 DIAGNOSIS — Z79899 Other long term (current) drug therapy: Secondary | ICD-10-CM | POA: Diagnosis not present

## 2023-09-20 DIAGNOSIS — E869 Volume depletion, unspecified: Secondary | ICD-10-CM | POA: Diagnosis present

## 2023-09-20 DIAGNOSIS — Y92000 Kitchen of unspecified non-institutional (private) residence as  the place of occurrence of the external cause: Secondary | ICD-10-CM | POA: Diagnosis not present

## 2023-09-20 DIAGNOSIS — I959 Hypotension, unspecified: Secondary | ICD-10-CM | POA: Diagnosis present

## 2023-09-20 DIAGNOSIS — T671XXD Heat syncope, subsequent encounter: Secondary | ICD-10-CM

## 2023-09-20 DIAGNOSIS — H9212 Otorrhea, left ear: Secondary | ICD-10-CM | POA: Diagnosis present

## 2023-09-20 DIAGNOSIS — Z853 Personal history of malignant neoplasm of breast: Secondary | ICD-10-CM | POA: Diagnosis not present

## 2023-09-20 DIAGNOSIS — D649 Anemia, unspecified: Secondary | ICD-10-CM | POA: Diagnosis present

## 2023-09-20 LAB — RENAL FUNCTION PANEL
Albumin: 2.9 g/dL — ABNORMAL LOW (ref 3.5–5.0)
Anion gap: 8 (ref 5–15)
BUN: 10 mg/dL (ref 8–23)
CO2: 26 mmol/L (ref 22–32)
Calcium: 8.1 mg/dL — ABNORMAL LOW (ref 8.9–10.3)
Chloride: 97 mmol/L — ABNORMAL LOW (ref 98–111)
Creatinine, Ser: 0.48 mg/dL (ref 0.44–1.00)
GFR, Estimated: >60 mL/min (ref >60)
Glucose, Bld: 122 mg/dL — ABNORMAL HIGH (ref 70–99)
Phosphorus: 3.1 mg/dL (ref 2.5–4.6)
Potassium: 3.3 mmol/L — ABNORMAL LOW (ref 3.5–5.1)
Sodium: 131 mmol/L — ABNORMAL LOW (ref 135–145)

## 2023-09-20 LAB — MAGNESIUM: Magnesium: 1.7 mg/dL (ref 1.7–2.4)

## 2023-09-20 MED ORDER — MEGESTROL ACETATE 400 MG/10ML PO SUSP
400.0000 mg | Freq: Every day | ORAL | Status: DC
  Administered 2023-09-20 – 2023-09-21 (×2): 400 mg via ORAL
  Filled 2023-09-20 (×3): qty 10

## 2023-09-20 MED ORDER — POTASSIUM CHLORIDE 20 MEQ PO PACK
40.0000 meq | PACK | ORAL | Status: AC
  Administered 2023-09-20 (×2): 40 meq via ORAL
  Filled 2023-09-20 (×2): qty 2

## 2023-09-20 MED ORDER — MAGNESIUM SULFATE 2 GM/50ML IV SOLN
2.0000 g | Freq: Once | INTRAVENOUS | Status: AC
  Administered 2023-09-20: 2 g via INTRAVENOUS
  Filled 2023-09-20: qty 50

## 2023-09-20 MED ORDER — CIPROFLOXACIN-DEXAMETHASONE 0.3-0.1 % OT SUSP
4.0000 [drp] | Freq: Two times a day (BID) | OTIC | Status: DC
  Administered 2023-09-20 – 2023-09-21 (×2): 4 [drp] via OTIC
  Filled 2023-09-20: qty 0.2
  Filled 2023-09-20: qty 7.5

## 2023-09-21 ENCOUNTER — Inpatient Hospital Stay (HOSPITAL_COMMUNITY)

## 2023-09-21 LAB — RENAL FUNCTION PANEL
Albumin: 3.2 g/dL — ABNORMAL LOW (ref 3.5–5.0)
Anion gap: 9 (ref 5–15)
BUN: 11 mg/dL (ref 8–23)
CO2: 23 mmol/L (ref 22–32)
Calcium: 8.6 mg/dL — ABNORMAL LOW (ref 8.9–10.3)
Chloride: 100 mmol/L (ref 98–111)
Creatinine, Ser: 0.46 mg/dL (ref 0.44–1.00)
GFR, Estimated: >60 mL/min (ref >60)
Glucose, Bld: 121 mg/dL — ABNORMAL HIGH (ref 70–99)
Phosphorus: 2.2 mg/dL — ABNORMAL LOW (ref 2.5–4.6)
Potassium: 3.8 mmol/L (ref 3.5–5.1)
Sodium: 132 mmol/L — ABNORMAL LOW (ref 135–145)

## 2023-09-21 LAB — CBC WITH DIFFERENTIAL/PLATELET
Abs Immature Granulocytes: 0 10*3/uL (ref 0.00–0.07)
Basophils Absolute: 0 10*3/uL (ref 0.0–0.1)
Basophils Relative: 0 %
Eosinophils Absolute: 0 10*3/uL (ref 0.0–0.5)
Eosinophils Relative: 0 %
HCT: 32.2 % — ABNORMAL LOW (ref 36.0–46.0)
Hemoglobin: 10.8 g/dL — ABNORMAL LOW (ref 12.0–15.0)
Lymphocytes Relative: 24 %
Lymphs Abs: 1.2 10*3/uL (ref 0.7–4.0)
MCH: 30.3 pg (ref 26.0–34.0)
MCHC: 33.5 g/dL (ref 30.0–36.0)
MCV: 90.2 fL (ref 80.0–100.0)
Monocytes Absolute: 0 10*3/uL — ABNORMAL LOW (ref 0.1–1.0)
Monocytes Relative: 0 %
Neutro Abs: 3.7 10*3/uL (ref 1.7–7.7)
Neutrophils Relative %: 76 %
Platelets: 151 10*3/uL (ref 150–400)
RBC: 3.57 MIL/uL — ABNORMAL LOW (ref 3.87–5.11)
RDW: 14.2 % (ref 11.5–15.5)
WBC: 4.9 10*3/uL (ref 4.0–10.5)
nRBC: 0 % (ref 0.0–0.2)
nRBC: 0 /100{WBCs}

## 2023-09-21 LAB — VITAMIN D 25 HYDROXY (VIT D DEFICIENCY, FRACTURES): Vit D, 25-Hydroxy: 48.69 ng/mL (ref 30–100)

## 2023-09-21 LAB — MAGNESIUM: Magnesium: 1.9 mg/dL (ref 1.7–2.4)

## 2023-09-21 LAB — VITAMIN B12: Vitamin B-12: 3013 pg/mL — ABNORMAL HIGH (ref 180–914)

## 2023-09-21 MED ORDER — K PHOS MONO-SOD PHOS DI & MONO 155-852-130 MG PO TABS
250.0000 mg | ORAL_TABLET | Freq: Three times a day (TID) | ORAL | Status: DC
  Administered 2023-09-21: 250 mg via ORAL
  Filled 2023-09-21: qty 1

## 2023-09-21 MED ORDER — K PHOS MONO-SOD PHOS DI & MONO 155-852-130 MG PO TABS: 250.0000 mg | ORAL_TABLET | Freq: Three times a day (TID) | ORAL | 0 refills | Status: AC

## 2023-09-21 MED ORDER — MEGESTROL ACETATE 400 MG/10ML PO SUSP: 400.0000 mg | Freq: Every day | ORAL | 0 refills | Status: AC

## 2023-09-21 MED ORDER — CIPROFLOXACIN-DEXAMETHASONE 0.3-0.1 % OT SUSP: 4.0000 [drp] | Freq: Two times a day (BID) | OTIC | 0 refills | Status: AC

## 2023-09-21 MED ORDER — ENSURE ENLIVE PO LIQD: 237.0000 mL | Freq: Two times a day (BID) | ORAL | 12 refills | Status: AC

## 2023-09-21 NOTE — Discharge Summary (Signed)
 Physician Discharge Summary  Patient ID: Dana Mora MRN: [id number] DOB/AGE: 1951-03-08 73 y.o.  Admit date: 09/18/2023 Discharge date: 09/21/2023  Admission Diagnoses:  Discharge Diagnoses:  Principal Problem:   Syncope Active Problems:   Breast cancer (HCC)   Essential hypertension   HLD (hyperlipidemia)   Anemia   Hypokalemia   Leukocytosis   Volume depletion   Ear discharge  -Acute or unhealed T3 compression fracture with associated mild marrow edema and approximately 20% height loss.  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  Patient was admitted with syncope and collapse.  Patient has had poor p.o. intake.  Around the time of syncope, patient was said to be hypotensive and bradycardic, requiring epinephrine drip.  Blood pressure and heart rate improved with epinephrine drip.  Telemetry monitoring during the hospital stay did not reveal any significant findings.  Patient has been adequately hydrated.  CT head without contrast did not reveal any significant findings.  Echocardiogram revealed normal ejection fraction with small pericardial effusion.  Potassium was monitored and repleted.  Potassium of 3.2 was noted on presentation.  MRI of the thoracic spine revealed mild acute compression of T3.  Interventional radiology team and neurosurgery were consulted.  Interventional radiology team has advised that there was no indication for kyphoplasty.  Neurosurgery team advised cervical collar.  Patient is stable for discharge.  1 month Holter monitoring on discharge (cardiology team has been consulted to arrange for the Holter monitor).  Patient will follow-up with primary care provider, oncology team, interventional radiology team and  neurosurgery team.  Workup done during the hospital stay also revealed mild esophageal dysmotility.  Patient will follow-up with GI team on discharge.      Syncope: - Suspect vasovagal versus orthostatic syncope, in the setting of volume depletion. - Patient was noted to be hypotensive and bradycardic at the time of syncope, requiring epinephrine drip. -Normal TSH - Vitals have remained stable. - Hypokalemia was been corrected. - Telemonitoring has not revealed any significant findings. - Patient has been hydrated.   - Echocardiogram did not reveal significant findings. - CTA did not reveal any significant findings. - Syncope has resolved. - 1 month telemetry monitoring on discharge.   Mid back pain/T3 compression fracture: -Likely secondary to the fall. - CT C-spine T-spine L-spine were unremarkable.  -MRI thoracic spine revealed acute or unhealed T3 compression fracture with associated mild marrow edema and approximately 20% height loss. No bony retropulsion, Schmorl's nodes at T9-T10 and significant stenosis.  - Interventional radiology team consulted.  As per IR, kyphoplasty was not indicated. - Neurosurgery team was consulted.  Cervical collar will be centralized. - Patient will follow-up with neurosurgery team on discharge. - Back pain was optimized during the hospital stay.   Hypertension: - Blood pressure is controlled. - Continue to hold amlodipine .     Anemia:  - Hemoglobin of 11.1 g/dL and MCV of 65.7. - Patient is followed up by hematology/oncology team.     Mild leukocytosis: - Resolved. - WBC of 4.2.     Hyperlipidemia: - On statins.   Mild hypokalemia: - Potassium of 3.2 on presentation. - Repleted. - Continue to monitor closely    Recurrent left ear discharge: - Follow cultures. - Start Ciprodex otic. -PCP to follow the result of final cultures - Follow-up with  ENT on discharge.   Dysphagia: - Follow-up with GI on discharge  -Work up revealed mild  esophageal dysmotility.   Breast cancer: - Status post surgery and is on chemotherapy  -Last chemotherapy was a couple to a few days prior to presentation    Consults: Interventional radiology and neurosurgery.  Significant Diagnostic Studies:  CT head without contrast revealed: 1. Right parietal scalp soft tissue swelling without underlying fracture. 2. Normal CT appearance of the brain. 3. Left middle ear and mastoid effusion. No obstructing nasopharyngeal lesion is present.   CT CHEST, ABDOMEN, AND PELVIS WITH CONTRAST:   TECHNIQUE: Multidetector CT imaging of the chest, abdomen and pelvis was performed following the standard protocol during bolus administration of intravenous contrast.   RADIATION DOSE REDUCTION: This exam was performed according to the departmental dose-optimization program which includes automated exposure control, adjustment of the mA and/or kV according to patient size and/or use of iterative reconstruction technique.   CONTRAST:  75mL OMNIPAQUE IOHEXOL 350 MG/ML SOLN   COMPARISON:  None Available.   FINDINGS: CT CHEST FINDINGS   Cardiovascular: No significant vascular findings. Normal heart size. No pericardial effusion.   Mediastinum/Nodes: There are hypodense bilateral thyroid nodules measuring 1 cm. No enlarged lymph nodes are identified. Esophagus is decompressed.   Lungs/Pleura: There are linear areas of atelectasis or scarring in the left lobe. There is some faint ground-glass opacities in the bilateral upper lobes. There is no pleural effusion or pneumothorax.   Musculoskeletal: Right mastectomy changes are present. There are right axillary surgical clips. No acute fracture identified. Please see dedicated x-ray of the thoracic spine for description.   CT ABDOMEN PELVIS FINDINGS   Hepatobiliary: There is diffuse fatty infiltration the liver. No focal liver abnormality is seen. No gallstones, gallbladder wall thickening, or  biliary dilatation.   Pancreas: Unremarkable. No pancreatic ductal dilatation or surrounding inflammatory changes.   Spleen: Normal in size without focal abnormality.   Adrenals/Urinary Tract: No adrenal hemorrhage or renal injury identified. Bladder is unremarkable. Moderate hypodensity in the superior pole of the right kidney measures 13 mm in and is favored as cyst.   Stomach/Bowel: Stomach is within normal limits. Appendix appears normal. No evidence of bowel wall thickening, distention, or inflammatory changes.   Vascular/Lymphatic: Aortic atherosclerosis. No enlarged abdominal or pelvic lymph nodes.   Reproductive: Uterus and bilateral adnexa are unremarkable.   Other: No abdominal wall hernia or abnormality. No abdominopelvic ascites.   Musculoskeletal: No fracture is seen.   IMPRESSION: 1. No acute posttraumatic sequelae in the chest, abdomen or pelvis. 2. Faint ground-glass opacities in the bilateral upper lobes, nonspecific. 3. Fatty infiltration of the liver. 4. Right renal cyst.  No follow-up imaging recommended. 5. Aortic atherosclerosis. 6. There are hypodense bilateral thyroid nodules measuring 1 cm. Not clinically significant; no follow-up imaging recommended (ref: J Am Coll Radiol. 2015 Feb;12(2): 143-50).   Aortic Atherosclerosis (ICD10-I70.0).     Electronically Signed   By: Tyron Gallon M.D.   On: 09/18/2023 19:52     CT CERVICAL SPINE WITHOUT CONTRAST:   TECHNIQUE: Multidetector CT imaging of the cervical spine was performed without intravenous contrast. Multiplanar CT image reconstructions were also generated.   RADIATION DOSE REDUCTION: This exam was performed according to the departmental dose-optimization program which includes automated exposure control, adjustment of the mA and/or kV according to patient size and/or use of iterative reconstruction technique.   COMPARISON:  None available.   FINDINGS: Alignment: No significant  listhesis is present. Cervical lordosis  is within normal limits.   Skull base and vertebrae: The craniocervical junction is normal. Vertebral body heights are normal. No acute fractures are present.   Soft tissues and spinal canal: No prevertebral fluid or swelling. No visible canal hematoma.   Disc levels: A broad-based disc osteophyte complex at C5-6 results in moderate left foraminal stenosis.   Upper chest: The lung apices are clear. The thoracic inlet is within normal limits.   IMPRESSION: 1. No acute fracture or traumatic subluxation. 2. Broad-based disc osteophyte complex at C5-6 results in moderate left foraminal stenosis.     Electronically Signed   By: Audree Leas M.D.   On: 09/18/2023 15:28    MRI THORACIC SPINE WITHOUT CONTRAST:   TECHNIQUE: Multiplanar, multisequence MR imaging of the thoracic spine was performed. No intravenous contrast was administered.   COMPARISON:  CT thoracic spine Sep 18, 2023   FINDINGS: Alignment:  No substantial sagittal subluxation.   Vertebrae: Acute or unhealed T3 compression fracture with associated marrow edema and approximately 20% height loss. No bony retropulsion. Schmorl's nodes at T9-T10 without associated marrow edema.   Cord:  Normal signal and morphology.   Paraspinal and other soft tissues: Negative.   Disc levels:   No significant canal or foraminal stenosis.   IMPRESSION: 1. Acute or unhealed T3 compression fracture with associated mild marrow edema and approximately 20% height loss. No bony retropulsion. 2. Schmorl's nodes at T9-T10. 3. No significant stenosis.     Electronically Signed   By: Stevenson Elbe M.D.   On: 09/19/2023 01:44    ESOPHAGUS/BARIUM SWALLOW/TABLET STUDY:   TECHNIQUE: Single contrast examination was performed using thin liquid barium. This exam was performed by [facility] PA-C, and was supervised and interpreted by Dr. Alexi Geibel Evert.    FLUOROSCOPY: Radiation Exposure Index (as provided by the fluoroscopic device): 5.5 mGy Kerma   COMPARISON:  None Available.   FINDINGS: Swallowing: Appears normal. No vestibular penetration or aspiration seen.   Pharynx: Unremarkable.   Esophagus: Normal appearance. No obvious mucosal abnormality or visible ulceration.   Esophageal motility: Mild dysmotility with intermittent tertiary contractions and proximal escape. No obvious stricture.   Hiatal Hernia: None visualized.   Gastroesophageal reflux: None visualized.   Ingested 13mm barium tablet: Not given due to reported difficulty swallowing large tablets.   Other: Limited study due to patient's ability to tolerate evaluation. Performed at approximately 30 degree table tilt due to limited mobility.   IMPRESSION: Mild esophageal dysmotility.   Discharge Exam: Blood pressure 96/72, pulse (!) 104, temperature 99.2 F (37.3 C), temperature source Oral, resp. rate 18, height 5\' 4"  (1.626 m), weight 65.3 kg, SpO2 100%.   Disposition: Discharge disposition: 01-Home or Self Care       Discharge Instructions     Ambulatory referral to Physical Therapy   Complete by: As directed    Iontophoresis - 4 mg/ml of dexamethasone: No   T.E.N.S. Unit Evaluation and Dispense as Indicated: No   Diet - low sodium heart healthy   Complete by: As directed    Increase activity slowly   Complete by: As directed       Allergies as of 09/21/2023       Reactions   Beef-derived Drug Products    Chicken Allergy    Fish Allergy    Pork-derived Products Other (See Comments)        Medication List     STOP taking these medications    amLODipine  5 MG tablet Commonly known as:  NORVASC        TAKE these medications    atorvastatin  20 MG tablet Commonly known as: LIPITOR Take 20 mg by mouth in the morning.   bismuth subsalicylate 262 MG chewable tablet Commonly known as: PEPTO BISMOL Chew 524 mg by mouth as needed  for indigestion or diarrhea or loose stools (after chemo).   ciprofloxacin-dexamethasone OTIC suspension Commonly known as: CIPRODEX Place 4 drops into the left ear 2 (two) times daily for 5 days.   feeding supplement Liqd Take 237 mLs by mouth 2 (two) times daily between meals. Start taking on: Sep 22, 2023   loperamide 2 MG tablet Commonly known as: IMODIUM A-D Take 2 mg by mouth as needed for diarrhea or loose stools (after chemo).   megestrol 400 MG/10ML suspension Commonly known as: MEGACE Take 10 mLs (400 mg total) by mouth daily. Start taking on: Sep 22, 2023   MULTIVITAMIN ADULT PO Take 1 tablet by mouth in the morning. Olly brand   phosphorus 155-852-130 MG tablet Commonly known as: K PHOS NEUTRAL Take 1 tablet (250 mg total) by mouth 3 (three) times daily for 4 doses.   traMADol 50 MG tablet Commonly known as: ULTRAM Take 50 mg by mouth every 8 (eight) hours as needed for moderate pain (pain score 4-6) or severe pain (pain score 7-10).               Durable Medical Equipment  (From admission, onward)           Start     Ordered   09/21/23 1630  For home use only DME Shower stool  Once        09/21/23 1630            Follow-up Information     Joaquin Mulberry, MD. Schedule an appointment as soon as possible for a visit in 2 week(s).   Specialty: Neurosurgery Contact information: [address] [place] [address] [phone]         [facility], [facility] Follow up.   Why: [employer] will ship a shower seat to the home. Contact information: [address] [place] [address] [phone]         [facility] Follow up.   Specialty: Rehabilitation Why: the Outpatient Center will call you to schedule outpatient therapy. Contact information: [address] [place]  [address] [phone]                Signed: Doroteo Gasmen 09/21/2023, 5:10 PM

## 2023-09-22 LAB — AEROBIC CULTURE W GRAM STAIN (SUPERFICIAL SPECIMEN): Gram Stain: NONE SEEN

## 2023-09-22 LAB — ZINC: Zinc: 42 ug/dL — ABNORMAL LOW (ref 44–115)

## 2023-09-23 ENCOUNTER — Telehealth: Payer: Self-pay | Admitting: Cardiology

## 2023-09-23 DIAGNOSIS — R55 Syncope and collapse: Secondary | ICD-10-CM

## 2023-10-14 ENCOUNTER — Encounter: Payer: Self-pay | Admitting: Nurse Practitioner

## 2023-10-16 ENCOUNTER — Telehealth: Admitting: Physician Assistant

## 2023-10-16 DIAGNOSIS — M549 Dorsalgia, unspecified: Secondary | ICD-10-CM

## 2023-10-20 ENCOUNTER — Other Ambulatory Visit: Payer: Self-pay | Admitting: Internal Medicine

## 2023-10-20 NOTE — Telephone Encounter (Signed)
 Send to patient's PCP please

## 2023-10-24 NOTE — Progress Notes (Signed)
  Because of severity and chronicity of pain, along with pain started after injury, I feel your condition warrants further evaluation and I recommend that you be seen in a face-to-face visit.   NOTE: There will be NO CHARGE for this E-Visit   If you are having a true medical emergency, please call 911.     For an urgent face to face visit, [facility] has multiple urgent care centers for your convenience.  Click the link below for the full list of locations and hours, walk-in wait times, appointment scheduling options and driving directions:  Urgent Care - [place], [place], [place], [place], [place], [place]  [place]     Your MyChart E-visit questionnaire answers were reviewed by a board certified advanced clinical practitioner to complete your personal care plan based on your specific symptoms.    Thank you for using e-Visits.

## 2023-10-28 ENCOUNTER — Other Ambulatory Visit (HOSPITAL_COMMUNITY): Payer: Self-pay | Admitting: Neurological Surgery

## 2023-10-28 DIAGNOSIS — S22030A Wedge compression fracture of third thoracic vertebra, initial encounter for closed fracture: Secondary | ICD-10-CM

## 2023-11-05 ENCOUNTER — Ambulatory Visit: Attending: Cardiology

## 2023-11-05 DIAGNOSIS — R55 Syncope and collapse: Secondary | ICD-10-CM | POA: Diagnosis not present

## 2023-11-08 ENCOUNTER — Ambulatory Visit: Payer: Self-pay | Admitting: Cardiology

## 2023-11-27 ENCOUNTER — Ambulatory Visit (HOSPITAL_COMMUNITY): Attending: Neurological Surgery

## 2023-11-27 ENCOUNTER — Encounter (HOSPITAL_COMMUNITY): Payer: Self-pay

## 2024-01-05 ENCOUNTER — Ambulatory Visit: Attending: Internal Medicine | Admitting: Internal Medicine
